# Patient Record
Sex: Female | Born: 1945
Health system: Southern US, Community
[De-identification: ages and names within clinical notes are randomized; demographics above are authoritative.]

## PROBLEM LIST (undated history)

## (undated) DIAGNOSIS — M7989 Other specified soft tissue disorders: Secondary | ICD-10-CM

## (undated) DIAGNOSIS — I10 Essential (primary) hypertension: Secondary | ICD-10-CM

## (undated) DIAGNOSIS — M1990 Unspecified osteoarthritis, unspecified site: Secondary | ICD-10-CM

## (undated) DIAGNOSIS — R32 Unspecified urinary incontinence: Secondary | ICD-10-CM

## (undated) HISTORY — DX: Unspecified urinary incontinence: R32

## (undated) HISTORY — DX: Unspecified osteoarthritis, unspecified site: M19.90

## (undated) HISTORY — DX: Essential (primary) hypertension: I10

## (undated) HISTORY — DX: Other specified soft tissue disorders: M79.89

---

## 1980-01-21 HISTORY — PX: ABDOMINAL HYSTERECTOMY: SHX81

## 2000-03-20 ENCOUNTER — Ambulatory Visit: Admission: RE | Admit: 2000-03-20 | Discharge: 2000-03-20 | Payer: Self-pay | Admitting: Neurosurgery

## 2000-03-20 ENCOUNTER — Encounter: Payer: Self-pay | Admitting: Neurosurgery

## 2001-01-20 HISTORY — PX: BACK SURGERY: SHX140

## 2001-01-25 ENCOUNTER — Encounter: Payer: Self-pay | Admitting: Neurosurgery

## 2001-01-27 ENCOUNTER — Inpatient Hospital Stay (HOSPITAL_COMMUNITY): Admission: RE | Admit: 2001-01-27 | Discharge: 2001-02-02 | Payer: Self-pay | Admitting: Neurosurgery

## 2001-01-27 ENCOUNTER — Encounter: Payer: Self-pay | Admitting: Neurosurgery

## 2001-03-02 ENCOUNTER — Ambulatory Visit (HOSPITAL_COMMUNITY): Admission: RE | Admit: 2001-03-02 | Discharge: 2001-03-02 | Payer: Self-pay | Admitting: Neurosurgery

## 2001-03-02 ENCOUNTER — Encounter: Payer: Self-pay | Admitting: Neurosurgery

## 2001-03-30 ENCOUNTER — Ambulatory Visit (HOSPITAL_COMMUNITY): Admission: RE | Admit: 2001-03-30 | Discharge: 2001-03-30 | Payer: Self-pay | Admitting: Neurosurgery

## 2001-03-30 ENCOUNTER — Encounter: Payer: Self-pay | Admitting: Neurosurgery

## 2001-06-01 ENCOUNTER — Encounter: Admission: RE | Admit: 2001-06-01 | Discharge: 2001-06-01 | Payer: Self-pay | Admitting: Neurosurgery

## 2001-06-01 ENCOUNTER — Encounter: Payer: Self-pay | Admitting: Neurosurgery

## 2001-07-01 ENCOUNTER — Encounter: Admission: RE | Admit: 2001-07-01 | Discharge: 2001-07-01 | Payer: Self-pay | Admitting: Neurosurgery

## 2001-07-01 ENCOUNTER — Encounter: Payer: Self-pay | Admitting: Neurosurgery

## 2001-08-03 ENCOUNTER — Encounter: Admission: RE | Admit: 2001-08-03 | Discharge: 2001-08-03 | Payer: Self-pay | Admitting: Neurosurgery

## 2001-08-03 ENCOUNTER — Encounter: Payer: Self-pay | Admitting: Neurosurgery

## 2001-10-05 ENCOUNTER — Encounter: Payer: Self-pay | Admitting: Neurosurgery

## 2001-10-05 ENCOUNTER — Encounter: Admission: RE | Admit: 2001-10-05 | Discharge: 2001-10-05 | Payer: Self-pay | Admitting: Neurosurgery

## 2002-04-05 ENCOUNTER — Encounter: Admission: RE | Admit: 2002-04-05 | Discharge: 2002-04-05 | Payer: Self-pay | Admitting: Neurosurgery

## 2002-04-05 ENCOUNTER — Encounter: Payer: Self-pay | Admitting: Neurosurgery

## 2005-08-21 ENCOUNTER — Encounter: Payer: Self-pay | Admitting: Vascular Surgery

## 2005-08-21 ENCOUNTER — Ambulatory Visit: Admission: RE | Admit: 2005-08-21 | Discharge: 2005-08-21 | Payer: Self-pay | Admitting: Endocrinology

## 2007-12-22 ENCOUNTER — Emergency Department (HOSPITAL_COMMUNITY): Admission: EM | Admit: 2007-12-22 | Discharge: 2007-12-23 | Payer: Self-pay | Admitting: Emergency Medicine

## 2007-12-24 ENCOUNTER — Emergency Department (HOSPITAL_COMMUNITY): Admission: EM | Admit: 2007-12-24 | Discharge: 2007-12-24 | Payer: Self-pay | Admitting: Emergency Medicine

## 2010-06-07 NOTE — Discharge Summary (Signed)
[place]  Patient:    Beverly Carlson, Beverly Carlson Visit Number: [id number] MRN: [id number]          Service Type: SUR Location: 3000 [id number] Attending Physician:  Mariam Dollar Dictated by:   Garlon Hatchet., M.D. 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  PROCEDURE:  Intracervical ______ and fusion from C4-C7, ______ at 5 and 6.  SECONDARY DIAGNOSES:  Diabetes mellitus, hypertension, complicating factors with coagulopathy.  HISTORY OF PRESENT ILLNESS:  Patient is a very pleasant 65 year old female who presented to the hospital.  Was admitted as an EMA and went to the operating room with the informed ______ procedure.  Postoperatively patient did very well. Went to the recovery room, then the floor.  On the floor patient was noted to be doing very well.  Had some mild amount of dysphagia as well as stiffness of her neck, but was eating well.  On hospital day #2 patient developed progressively worse dysphagia and a small amount of swelling underneath her incision.  Routine blood work showed an extension of her coagulopathy with INR 1.5.  Patient was given FNP and vitamin K and transferred to the step-down unit overnight but over the next few days patients coagulopathy slowly improved with an INR 1.2-1.3 and her dysphagia improved on steroids.  Patient continued to mobilize with physical therapy. Got progressively better and better and was able to be discharged home on the 14th.  At the time of discharge patient was neurologically stable with significantly improved arm pain and sensation with 5/5 strength.  Her swelling was intact.  Was able to tolerate soft foods.  Never had any breathing difficulty.  INR was 1.2 on discharge.  Patient was scheduled to follow up in two weeks in clinic  ______. Dictated by:   Garlon Hatchet., M.D. Attending Physician:  Mariam Dollar DD:  02/25/01 TD:  02/25/01 Job: [id number]

## 2010-06-07 NOTE — Op Note (Signed)
[place]. [facility]  Patient:    Beverly Carlson, Beverly Carlson Visit Number: [id number] MRN: [id number]          Service Type: SUR Location: 3100 [id number] Attending Physician:  Mariam Dollar Dictated by:   Garlon Hatchet., M.D. Proc. 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  PROCEDURES:  Anterior cervical corpectomies and fusion of C5 and C6 with an anterior cervical diskectomy and fusion using the Premier plate and a fibular strut graft.  SURGEON:  Garlon Hatchet., M.D.  ASSISTANT:  Julio Sicks, M.D.  ANESTHESIA:  General endotracheal.  CLINICAL NOTE:  The patient is a very pleasant 65 year old female who has had longstanding neck and left greater than right arm pain with numbness and tingling in her hands, with decreased strength in her hands and difficulty with her walking.  The patients preoperative imaging showed severe spinal stenosis from large ruptured disks and ossification of the posterior longitudinal ligament.  The patient was extensively counseled as to the risks of this degree of spinal cord compression and recommended to have two-level corpectomies and three-level diskectomy with a fusion from C4 to C7.  The patient was extensively explained the risks and benefits of surgery to include but not limited to bleeding, infection, spinal fluid leak, spinal cord injury, nerve damage, failure of fusion.  She understands and agrees to proceed forward.  DESCRIPTION OF PROCEDURE:  The patient was brought in the OR, was induced under general anesthesia, positioned supine with the neck in slight extension, five pounds of Holter traction.  The left side of her neck was prepped and draped in the routine sterile fashion.  Intraoperative x-ray with fluoroscopy confirmed  localization of the incision over the C5-6 disk space.  A transverse incision was made.  The superficial layer was dissected out and divided longitudinally.  The avascular plane between the sternocleidomastoid and the omohyoid was developed down to prevertebral fascia.  The prevertebral fascia was dissected away, exposing the C4, C5, C6, and C7 vertebral bodies and all those disk spaces.  The longus colli was reflected laterally.  Intraoperative x-ray confirmed localization of the C5-6 disk space.  There were large anterior osteophytes bitten off with the Leksell rongeur.  All three interspaces were incised with an 11 blade scalpel, and pituitary rongeurs were used to remove the anterior margin of the annulus.  They were noted to be large and calcified.  Using a high-speed drill with a blue 8 drill bit, the interspaces were drilled down approximately halfway through the annulus and nucleus pulposus.  Then using a Leksell rongeur, the anterior margin of the vertebral bodies of C6 and C5 were removed and then using the blue 7 drill bit and the high-speed drill, the remainder of the C6 and C5 vertebral bodies were drilled down to the posterior longitudinal ligament.  There was a lot of venous ooze appreciated.  This was controlled with Gelfoam and bone wax.  Then the C6 vertebral body kind of gave way to the posterior longitudinal ligament and using a 1 and 2 mm Kerrison punch, the remainder of the posterior aspect of the vertebral body was able to be removed and gutters were marched up on each side, leaving  the central stenotic part of the ossified ligament until the very end.  After both gutters had been done from C4 to C7, the remainder of the central stenosis was teased anterior and pulled away from the spinal cord.  There was a dramatic amount of spinal cord compression from a large ossified ligament being displaced from ruptured disk.  These were all teased away with microdissection  techniques and removed with the 2 and 3 mm Kerrison punch.  At the end of the diskectomies and two-level corpectomies with C6-7 being the most stenotic area, the thecal sac was noted to be widely decompressed and came right back up ventrally.  Then several large disk fragments were teased out with an angled nerve hook from behind the C6-7 disk space and C7 vertebral body and at the end of all diskectomies, both the C7 end plate and C4 end plate were all prepared to receive the bone graft. Shelves were made at the inferior aspect and posterior aspect of both vertebral bodies at C4 and C7.  Then the fibular allograft was sized, selected, and cut and fashioned to insert, then using a 0 silk tie, tied around the center of this and using the Caspar retractor pins, the C4 and C7 vertebral bodies were distracted against each other and the fibular strut was inserted.  Then the distractor pins were removed, the weight was removed from the head.  X-ray was brought in and confirmed good placement of the strut. Then the Premier plate was sized, selected, and using two fixed 13 mm screws in the vertebral body of C7 and two variable 13 mm screws in the vertebral body of C4 as well as tightening down the lock nuts and the locking mechanism, this plate was inserted.  Fluoroscopy confirmed good placement of the screws and plate.  The wound was copiously irrigated.  Meticulous hemostasis was maintained.  A JP drain was placed and the platysma was reapproximated with 3-0 interrupted Vicryl.  The skin was closed with running 4-0 subcuticular. Benzoin and Steri-Strips were applied.  The patient went to the recovery room in stable condition.  At the end of the case, all needle counts and sponge counts were correct. Dictated by:   Garlon Hatchet., M.D. Attending Physician:  Mariam Dollar DD:  01/27/01 TD:  01/28/01 Job: [id number]

## 2010-06-10 ENCOUNTER — Encounter (INDEPENDENT_AMBULATORY_CARE_PROVIDER_SITE_OTHER): Payer: Self-pay | Admitting: General Surgery

## 2010-08-16 ENCOUNTER — Encounter (INDEPENDENT_AMBULATORY_CARE_PROVIDER_SITE_OTHER): Payer: Self-pay | Admitting: General Surgery

## 2010-08-16 ENCOUNTER — Other Ambulatory Visit (INDEPENDENT_AMBULATORY_CARE_PROVIDER_SITE_OTHER): Payer: Self-pay | Admitting: General Surgery

## 2010-08-16 ENCOUNTER — Ambulatory Visit (INDEPENDENT_AMBULATORY_CARE_PROVIDER_SITE_OTHER): Payer: No Typology Code available for payment source | Admitting: General Surgery

## 2010-08-16 VITALS — Temp 96.2°F | Wt 255.4 lb

## 2010-08-16 DIAGNOSIS — E1149 Type 2 diabetes mellitus with other diabetic neurological complication: Secondary | ICD-10-CM

## 2010-08-16 DIAGNOSIS — E1143 Type 2 diabetes mellitus with diabetic autonomic (poly)neuropathy: Secondary | ICD-10-CM

## 2010-08-16 DIAGNOSIS — L600 Ingrowing nail: Secondary | ICD-10-CM

## 2010-08-16 DIAGNOSIS — G909 Disorder of the autonomic nervous system, unspecified: Secondary | ICD-10-CM

## 2010-08-16 NOTE — Progress Notes (Signed)
Subjective:     Patient ID: Beverly Carlson, female   DOB: Jun 05, 1945, 65 y.o.   MRN: [id number]  HPI This is a has chronic subungal ungual toenail infections fungus and is having problems with her generalized muscle weakness for which she is on steroids presently done and 15 mg prednisone daily I last saw her approximately 6 weeks and she says her muscle weakness is improving. Her diabetes however have been all types of adjustments by Dr. Lucianne Muss that is being controlled with.  She said she's had a little pain in the left great toenail over the last week but has not noticed any fever or drainage  Review of SystemsCurrent outpatient prescriptions:furosemide (LASIX) 10 MG/ML solution, Take by mouth daily.  , Disp: , Rfl: ;  ramipril (ALTACE) 1.25 MG capsule, Take 1.25 mg by mouth daily.  , Disp: , Rfl:      Objective:   Physical Exam Patient has bilateral edema probably 2+ no evidence of cellulitis and no ulcers around her ulcer ankles her toenails are curved in. and and appeared vision is  fungal infection. I trimmed all 10 toenails but on the left great toenail is an obvious infection that has a little spell and had sent a definite culture trim the nail back to its base where there is a chronic ingrown toenail both medial and lateral. I she had a similar problem about 2 years ago that I had her on Keflex and given her a prescription for 500 mg q.i.d. saline the culture and will need to see her next week she is going to soak her feet twice a day and follow Betadine to the nail areas    Assessment:       Plan:    Return next Thursday and the patient is aware she's having increased pain drainage redness or worse problems to call Robin and let me see her sooner

## 2010-08-16 NOTE — Patient Instructions (Signed)
Soap twice a day in warm water both feet and apply Betadine solution to the toenail areas. Start Keflex 500 mg q.i.d. today and if any increased swelling pain or problen : Beverly Carlson so I can see you earlier

## 2010-08-19 LAB — WOUND CULTURE

## 2010-08-22 ENCOUNTER — Encounter (INDEPENDENT_AMBULATORY_CARE_PROVIDER_SITE_OTHER): Payer: Self-pay | Admitting: General Surgery

## 2010-08-22 ENCOUNTER — Ambulatory Visit (INDEPENDENT_AMBULATORY_CARE_PROVIDER_SITE_OTHER): Payer: No Typology Code available for payment source | Admitting: General Surgery

## 2010-08-22 DIAGNOSIS — L600 Ingrowing nail: Secondary | ICD-10-CM

## 2010-08-22 DIAGNOSIS — R600 Localized edema: Secondary | ICD-10-CM

## 2010-08-22 DIAGNOSIS — R609 Edema, unspecified: Secondary | ICD-10-CM

## 2010-08-22 DIAGNOSIS — E109 Type 1 diabetes mellitus without complications: Secondary | ICD-10-CM

## 2010-08-22 NOTE — Progress Notes (Signed)
Subjective:     Patient ID: Beverly Carlson, female   DOB: [birth year], 65 y.o.   MRN: [id number]  HPIPatient returns and a tight left great is greatly improved the cultures showed staph epi not aureus MRSA and everything is progressing nicely she's got a few more days of Keflex which she will complete her legs are less swollen and Dr. Lucianne Muss has increased his Lasix again and I recommend that she get away H. placed between her mattress and then spurring to try to keep her feet elevated above her heart at night I will plan on seeing her in approximately 3 weeks or 4 and following the chronic fungal infection of the toenails  Review of Systems     Objective:   Physical Exam     Assessment:        Plan:    Complete antibiotics and pliable antibiotic ointment to the  toenails. Elevate it with the as much as possible to decrease the edema

## 2010-08-22 NOTE — Patient Instructions (Signed)
Continue the antibiotics until completed continue soaking her feet and upon the antibiotic ointment. Elevate her feet as much as possible to decrease the edema

## 2010-08-27 ENCOUNTER — Encounter (HOSPITAL_BASED_OUTPATIENT_CLINIC_OR_DEPARTMENT_OTHER): Payer: No Typology Code available for payment source | Attending: General Surgery

## 2010-08-27 ENCOUNTER — Other Ambulatory Visit (HOSPITAL_BASED_OUTPATIENT_CLINIC_OR_DEPARTMENT_OTHER): Payer: Self-pay | Admitting: General Surgery

## 2010-08-27 DIAGNOSIS — IMO0001 Reserved for inherently not codable concepts without codable children: Secondary | ICD-10-CM | POA: Insufficient documentation

## 2010-08-27 DIAGNOSIS — I872 Venous insufficiency (chronic) (peripheral): Secondary | ICD-10-CM | POA: Insufficient documentation

## 2010-08-27 DIAGNOSIS — Z79899 Other long term (current) drug therapy: Secondary | ICD-10-CM | POA: Insufficient documentation

## 2010-08-27 DIAGNOSIS — E669 Obesity, unspecified: Secondary | ICD-10-CM | POA: Insufficient documentation

## 2010-08-27 DIAGNOSIS — L97809 Non-pressure chronic ulcer of other part of unspecified lower leg with unspecified severity: Secondary | ICD-10-CM | POA: Insufficient documentation

## 2010-08-27 DIAGNOSIS — R609 Edema, unspecified: Secondary | ICD-10-CM | POA: Insufficient documentation

## 2010-08-27 DIAGNOSIS — Z794 Long term (current) use of insulin: Secondary | ICD-10-CM | POA: Insufficient documentation

## 2010-08-27 DIAGNOSIS — E119 Type 2 diabetes mellitus without complications: Secondary | ICD-10-CM | POA: Insufficient documentation

## 2010-08-27 DIAGNOSIS — IMO0002 Reserved for concepts with insufficient information to code with codable children: Secondary | ICD-10-CM | POA: Insufficient documentation

## 2010-08-27 DIAGNOSIS — I10 Essential (primary) hypertension: Secondary | ICD-10-CM | POA: Insufficient documentation

## 2010-08-27 LAB — GLUCOSE, CAPILLARY: Glucose-Capillary: 111 mg/dL — ABNORMAL HIGH (ref 70–99)

## 2010-09-09 ENCOUNTER — Encounter (INDEPENDENT_AMBULATORY_CARE_PROVIDER_SITE_OTHER): Payer: No Typology Code available for payment source

## 2010-09-09 DIAGNOSIS — L97909 Non-pressure chronic ulcer of unspecified part of unspecified lower leg with unspecified severity: Secondary | ICD-10-CM

## 2010-09-13 ENCOUNTER — Encounter (INDEPENDENT_AMBULATORY_CARE_PROVIDER_SITE_OTHER): Payer: Self-pay | Admitting: General Surgery

## 2010-09-13 ENCOUNTER — Ambulatory Visit (INDEPENDENT_AMBULATORY_CARE_PROVIDER_SITE_OTHER): Payer: No Typology Code available for payment source | Admitting: General Surgery

## 2010-09-13 VITALS — BP 136/74 | HR 76 | Temp 97.9°F | Ht 66.0 in | Wt 244.4 lb

## 2010-09-13 DIAGNOSIS — E119 Type 2 diabetes mellitus without complications: Secondary | ICD-10-CM

## 2010-09-13 DIAGNOSIS — L600 Ingrowing nail: Secondary | ICD-10-CM

## 2010-09-13 NOTE — Procedures (Unsigned)
DUPLEX DEEP VENOUS EXAM - LOWER EXTREMITY  INDICATION:  Ulcer on right shin for 1 month.  HISTORY:  Edema:  Yes. Trauma/Surgery:  No. Pain:  No. PE:  No. Previous DVT:  No. Anticoagulants:  No. Other:  Prednisone use.  DUPLEX EXAM:               CFV   SFV   PopV  PTV    GSV               R  L  R  L  R  L  R   L  R  L Thrombosis    o  o  o  o  o  o  o   o  o  o Spontaneous   +  +  +  +  +  + Phasic        +  +  +  +  +  + Augmentation  +  +  +  +  +  +  +   +  +  + Compressible  +  +  +  +  +  +         +  + Competent  Legend:  + - yes  o - no  p - partial  D - decreased  IMPRESSION: 1. No evidence of deep venous thrombosis or superficial venous     thrombus in the bilateral lower extremities.  However, distal     superficial femoral vein and calf veins were difficult to visualize     due to edema. 2. Valsalva and manual compressions were not effective at     demonstrating incompetency due to increased venous flow in the     bilateral lower extremities.   _____________________________ Di Kindle. Edilia Bo, M.D.  LT/MEDQ  D:  09/09/2010  T:  09/09/2010  Job:  [id number]

## 2010-09-13 NOTE — Patient Instructions (Signed)
Continue wash and scrub in her toenails as usual he may apply just a little bit of antibiotic ointment to the left second toe if there's any bleeding continue with the use support stockings and hopefully decrease in the swelling of the lower extremity

## 2010-09-13 NOTE — Progress Notes (Signed)
Subjective:     Patient ID: Beverly Carlson, female   DOB: [birth year], 65 y.o.   MRN: [id number]  HPIPatient returns and she is continuing to improve the facial swelling from the steroids are definitely bad in since I've seen her she's had a complete arterial and venous Doppler studies of the lower extremities and reports that everything looked good. She had some alters around her ankle where she was massively swollen but these have healed. The toenails where she had a little abscess on the left great toe nail bed his healed there is a little ingrown toenail on the left lateral that I clipped but that was the only procedure needed to do.   Review of Systems     Objective:   Physical Exam     Assessment:    Improve and chronic fungal toenail infections on all 10 toes with no active bacterial infection at this time continues to conservative management or lymphedema secondary to her high dose steroids but the steroids are then reduced and her neuropathy is improved     Plan:    Continue the same soaking toenails and trying to correct or prevent the pedal edema. See me in 2 months

## 2010-09-24 ENCOUNTER — Encounter (HOSPITAL_BASED_OUTPATIENT_CLINIC_OR_DEPARTMENT_OTHER): Payer: No Typology Code available for payment source

## 2010-10-24 LAB — URINALYSIS, ROUTINE W REFLEX MICROSCOPIC
Nitrite: NEGATIVE
Specific Gravity, Urine: 1.012 (ref 1.005–1.030)
Specific Gravity, Urine: 1.025 (ref 1.005–1.030)
Urobilinogen, UA: 0.2 mg/dL (ref 0.0–1.0)
Urobilinogen, UA: 0.2 mg/dL (ref 0.0–1.0)

## 2010-10-24 LAB — URINE MICROSCOPIC-ADD ON

## 2010-10-24 LAB — CBC
Hemoglobin: 13.7 g/dL (ref 12.0–15.0)
RBC: 4.77 MIL/uL (ref 3.87–5.11)
RDW: 13.2 % (ref 11.5–15.5)

## 2010-10-24 LAB — URINE CULTURE

## 2010-11-13 ENCOUNTER — Encounter (INDEPENDENT_AMBULATORY_CARE_PROVIDER_SITE_OTHER): Payer: Self-pay | Admitting: General Surgery

## 2010-11-13 ENCOUNTER — Ambulatory Visit (INDEPENDENT_AMBULATORY_CARE_PROVIDER_SITE_OTHER): Payer: No Typology Code available for payment source | Admitting: General Surgery

## 2010-11-13 VITALS — BP 106/72 | HR 68 | Temp 95.6°F | Resp 20 | Ht 66.25 in | Wt 245.2 lb

## 2010-11-13 DIAGNOSIS — E119 Type 2 diabetes mellitus without complications: Secondary | ICD-10-CM

## 2010-11-13 DIAGNOSIS — B351 Tinea unguium: Secondary | ICD-10-CM

## 2010-11-13 NOTE — Progress Notes (Signed)
Subjective:     Patient ID: Beverly Carlson, female   DOB: September 15, 1945, 65 y.o.   MRN: [id number]  HPIInstead a return she is now approximately 6 weeks since I last saw had a good trip when she went in cared for her grandchildren recently and returned her toenails are basically very hyper taken chronic so fungal infection on 1210 I. the little area on the left great toe nail that had the abscess previously no evidence of any active infection at what they're still a little bit more cannulation tissue and ingrown aspect of her nail (. She's done on her steroids onto 5 mg a day and looks more like she used to look and says that she's doing much better from the neurological problems that were treated with the steroids.   Review of Systems Current Outpatient Prescriptions  Medication Sig Dispense Refill  . BD INSULIN SYRINGE ULTRAFINE 31G X 5/16" 0.5 ML MISC Daily.      . bisoprolol-hydrochlorothiazide (ZIAC) 10-6.25 MG per tablet Daily.      . furosemide (LASIX) 10 MG/ML solution Take by mouth daily.        Marland Kitchen LANTUS 100 UNIT/ML injection Daily.      . metFORMIN (GLUCOPHAGE-XR) 500 MG 24 hr tablet Daily.      . metolazone (ZAROXOLYN) 5 MG tablet Daily.      Marland Kitchen NOVOLOG 100 UNIT/ML injection       . predniSONE (DELTASONE) 10 MG tablet 5 mg Daily.       . ramipril (ALTACE) 1.25 MG capsule Take 1.25 mg by mouth daily.        Lilian Kapur 625 MG tablet        Past Surgical History  Procedure Date  . Back surgery 2003  . Abdominal hysterectomy 1982    partial       Objective:   Physical ExamBP 106/72  Pulse 68  Temp 95.6 F (35.3 C)  Resp 20  Ht 5' 6.25" (1.683 m)  Wt 245 lb 4 oz (111.245 kg)  BMI 39.29 kg/m2 I inspected all 10 of her nails and TRAM each of the nails the left great toenail is somewhat angry and trim back the area but I don't think is the fact fraction she is a possible triple antibiotic ointment to that nail and will get a nail file r Board to count paramount of sand and the top of  the nails that are checked and are closed. I recommended that she make arrangements to see one of the podiatrist I think it's tried foot specialist as is not any of my partners do any type of trauma mouth chronic toenail problems. I will plan on seeing her again in December and had placed blue she can get completely off the prednisone by that time     Assessment:         Plan:     See above note

## 2010-12-09 NOTE — H&P (Signed)
  NAME:  RECIE, CIRRINCIONE NO.:  [id number]  MEDICAL RECORD NO.:  [id number]  LOCATION:  FOOT                         FACILITY:  [facility]  PHYSICIAN:  Ardath Sax, M.D.     DATE OF BIRTH:  1945/07/06  DATE OF ADMISSION:  08/27/2010 DATE OF DISCHARGE:                             HISTORY & PHYSICAL   She is a 65 year old very obese African American female who was here because of venous stasis ulcers on her right leg.  She has a history of myositis of some sort and her doctor has put her on high doses of prednisone which has caused her to have a great deal of edema.  She has pitting edema of both of her extremities with ulcers on the anterior aspect of her right leg that are quite painful and she was sent here for treatment of this situation.  The ulcer measures 17 x 5 cm and are 0.1 cm deep.  Her blood pressure is 127/77.  Her respirations are 19.  Her pulse is 79.  Her temperature is 98.6.  she is on many medicines including Ziac 6.25 and she is also on Zaroxolyn.  She takes NovoLog insulin, Lantus, she takes 54 units at bedtime, and she also takes 10-24 units of Humalog twice a day.  She is also on Glucophage, Altace, and she takes 80 mg of Lasix a day.  I feel because of the marked edema and the venous ulcers, she should be getting off her prednisone because I do not really see any reason for it, so she is going to talk her doctor who said he is already planning to slowly decrease the dosage.  She needs to have Unna boots for these venous ulcers.  I could feel excellent pulses, but because of the clinic routinely orders vascular studies prior to [employer], we ordered vascular studies and on this visit we put Silvadene on and wrapped dressings with Profore light.  She is a lady who has peripheral edema.  She is diabetic.  She does have hypertension. She is markedly obese and she is on many medicines, and I would like to get her off at least the prednisone, and when  she comes back next week, we will plan on putting on Unna boots if the vascular studies are okay.     Ardath Sax, M.D.     PP/MEDQ  D:  08/27/2010  T:  08/28/2010  Job:  [id number]  Electronically Signed by Ardath Sax  on 12/09/2010 02:42:36 PM

## 2011-01-02 ENCOUNTER — Encounter (INDEPENDENT_AMBULATORY_CARE_PROVIDER_SITE_OTHER): Payer: No Typology Code available for payment source | Admitting: General Surgery

## 2011-01-09 ENCOUNTER — Ambulatory Visit (INDEPENDENT_AMBULATORY_CARE_PROVIDER_SITE_OTHER): Payer: Medicare Other | Admitting: General Surgery

## 2011-01-09 ENCOUNTER — Encounter (INDEPENDENT_AMBULATORY_CARE_PROVIDER_SITE_OTHER): Payer: Self-pay | Admitting: General Surgery

## 2011-01-09 DIAGNOSIS — E1142 Type 2 diabetes mellitus with diabetic polyneuropathy: Secondary | ICD-10-CM

## 2011-01-09 DIAGNOSIS — E119 Type 2 diabetes mellitus without complications: Secondary | ICD-10-CM

## 2011-01-09 DIAGNOSIS — B351 Tinea unguium: Secondary | ICD-10-CM | POA: Insufficient documentation

## 2011-01-09 DIAGNOSIS — E1140 Type 2 diabetes mellitus with diabetic neuropathy, unspecified: Secondary | ICD-10-CM

## 2011-01-09 DIAGNOSIS — E1149 Type 2 diabetes mellitus with other diabetic neurological complication: Secondary | ICD-10-CM

## 2011-01-09 NOTE — Patient Instructions (Signed)
He is in [employer] to count was then softened  and flaten areas were I trimed your toenails. Make an appointment with the podiatrist in about 6 weeks

## 2011-01-09 NOTE — Progress Notes (Signed)
Patient ID: Beverly Carlson, female   DOB: March 14, 1945, 65 y.o.   MRN: [id number] BP 140/78  Pulse 68  Temp(Src) 97.4 F (36.3 C) (Temporal)  Resp 16  Ht 5' 6.25" (1.683 m)  Wt 236 lb 6.4 oz (107.23 kg)  BMI 37.87 kg/m2 Beverly Carlson returns now proximally 6 weeks since her last treatment of her chronic fungal toenail infection and says she is not actually called to make an appointment with the podiatrist but has their number. She read about my retirement in the diaper and said it stated that my partners would continue CM my patients but I explained to her that her chronic fungal toenail infection or not problems at general surgeons usually continue following her even though she's been my patient for 20 years. I think she would be better managed by a podiatrist and she will call to make the appointment. Her steroids have been tapered by the neurologist at [facility] who were treated her for her neuropathy and she looks closer to watch her chronic appearance is. She's still been followed by the endocrinologist for her diabetes. I trimmed all 10 of her toenails today she has this chronic elevated thickened [name] and would recommend that she use an Rye board to try to smooth the areas and hopefully the podiatrist will have a better solution even though she continued to have alternatives as not had inactive infection and I have been managed her for many many years

## 2012-08-24 ENCOUNTER — Ambulatory Visit: Payer: Medicare Other | Admitting: Endocrinology

## 2012-09-13 ENCOUNTER — Other Ambulatory Visit: Payer: Self-pay | Admitting: *Deleted

## 2012-09-13 MED ORDER — COLESEVELAM HCL 625 MG PO TABS: ORAL_TABLET | ORAL | Status: DC

## 2012-09-27 ENCOUNTER — Ambulatory Visit: Payer: Medicare Other | Admitting: Endocrinology

## 2012-10-26 ENCOUNTER — Ambulatory Visit: Payer: Medicare Other | Admitting: Endocrinology

## 2012-11-10 ENCOUNTER — Other Ambulatory Visit: Payer: Self-pay | Admitting: *Deleted

## 2012-11-10 MED ORDER — "INSULIN SYRINGE-NEEDLE U-100 31G X 5/16"" 0.5 ML MISC": Status: DC

## 2012-11-10 MED ORDER — RAMIPRIL 10 MG PO CAPS: ORAL_CAPSULE | ORAL | Status: DC

## 2012-11-10 MED ORDER — BISOPROLOL-HYDROCHLOROTHIAZIDE 10-6.25 MG PO TABS: 1.0000 | ORAL_TABLET | Freq: Every day | ORAL | Status: DC

## 2012-11-17 ENCOUNTER — Other Ambulatory Visit: Payer: Self-pay | Admitting: *Deleted

## 2012-11-17 ENCOUNTER — Telehealth: Payer: Self-pay | Admitting: Endocrinology

## 2012-11-17 MED ORDER — "INSULIN SYRINGE-NEEDLE U-100 31G X 5/16"" 1 ML MISC": 1.0000 | Freq: Two times a day (BID) | Status: DC

## 2012-11-18 ENCOUNTER — Other Ambulatory Visit: Payer: Self-pay | Admitting: *Deleted

## 2012-11-18 MED ORDER — "INSULIN SYRINGE-NEEDLE U-100 31G X 5/16"" 1 ML MISC": 1.0000 | Freq: Two times a day (BID) | Status: DC

## 2012-11-19 ENCOUNTER — Ambulatory Visit: Payer: Medicare Other | Admitting: Endocrinology

## 2012-12-13 ENCOUNTER — Encounter: Payer: Self-pay | Admitting: Endocrinology

## 2012-12-13 ENCOUNTER — Ambulatory Visit (INDEPENDENT_AMBULATORY_CARE_PROVIDER_SITE_OTHER): Payer: Medicare Other | Admitting: Endocrinology

## 2012-12-13 VITALS — BP 138/80 | HR 74 | Temp 98.5°F | Resp 12 | Ht 66.25 in | Wt 241.4 lb

## 2012-12-13 DIAGNOSIS — R609 Edema, unspecified: Secondary | ICD-10-CM

## 2012-12-13 DIAGNOSIS — E1142 Type 2 diabetes mellitus with diabetic polyneuropathy: Secondary | ICD-10-CM

## 2012-12-13 DIAGNOSIS — E1149 Type 2 diabetes mellitus with other diabetic neurological complication: Secondary | ICD-10-CM

## 2012-12-13 DIAGNOSIS — I10 Essential (primary) hypertension: Secondary | ICD-10-CM

## 2012-12-13 DIAGNOSIS — M3320 Polymyositis, organ involvement unspecified: Secondary | ICD-10-CM

## 2012-12-13 DIAGNOSIS — E1140 Type 2 diabetes mellitus with diabetic neuropathy, unspecified: Secondary | ICD-10-CM

## 2012-12-13 DIAGNOSIS — R600 Localized edema: Secondary | ICD-10-CM | POA: Insufficient documentation

## 2012-12-13 DIAGNOSIS — E785 Hyperlipidemia, unspecified: Secondary | ICD-10-CM

## 2012-12-13 LAB — CBC WITH DIFFERENTIAL/PLATELET
Basophils Absolute: 0 10*3/uL (ref 0.0–0.1)
Eosinophils Relative: 3.1 % (ref 0.0–5.0)
MCV: 88.2 fl (ref 78.0–100.0)
Monocytes Absolute: 0.4 10*3/uL (ref 0.1–1.0)
Monocytes Relative: 9 % (ref 3.0–12.0)
Neutrophils Relative %: 46.3 % (ref 43.0–77.0)
Platelets: 246 10*3/uL (ref 150.0–400.0)
RDW: 14.1 % (ref 11.5–14.6)
WBC: 4.4 10*3/uL — ABNORMAL LOW (ref 4.5–10.5)

## 2012-12-13 LAB — MICROALBUMIN / CREATININE URINE RATIO
Creatinine,U: 161.4 mg/dL
Microalb Creat Ratio: 1.2 mg/g (ref 0.0–30.0)
Microalb, Ur: 2 mg/dL — ABNORMAL HIGH (ref 0.0–1.9)

## 2012-12-13 LAB — URINALYSIS, ROUTINE W REFLEX MICROSCOPIC
Bilirubin Urine: NEGATIVE
Ketones, ur: NEGATIVE
Leukocytes, UA: NEGATIVE
Urobilinogen, UA: 0.2 (ref 0.0–1.0)

## 2012-12-13 LAB — LIPID PANEL
Cholesterol: 215 mg/dL — ABNORMAL HIGH (ref 0–200)
HDL: 35.1 mg/dL — ABNORMAL LOW (ref >39.00)
Total CHOL/HDL Ratio: 6
Triglycerides: 134 mg/dL (ref 0.0–149.0)

## 2012-12-13 LAB — COMPREHENSIVE METABOLIC PANEL
AST: 25 U/L (ref 0–37)
BUN: 28 mg/dL — ABNORMAL HIGH (ref 6–23)
CO2: 22 mEq/L (ref 19–32)
Calcium: 9.3 mg/dL (ref 8.4–10.5)
Chloride: 106 mEq/L (ref 96–112)
Creatinine, Ser: 1.1 mg/dL (ref 0.4–1.2)
GFR: 66.46 mL/min (ref >60.00)
Glucose, Bld: 67 mg/dL — ABNORMAL LOW (ref 70–99)

## 2012-12-13 LAB — LDL CHOLESTEROL, DIRECT: Direct LDL: 159.5 mg/dL

## 2012-12-13 NOTE — Patient Instructions (Signed)
Welchol 3 tabs twice daily

## 2012-12-13 NOTE — Progress Notes (Signed)
Patient ID: Beverly Carlson, female   DOB: 01-Jun-1945, 67 y.o.   MRN: [id number]   Reason for Appointment: Diabetes follow-up   History of Present Illness   Diagnosis: Type 2 DIABETES MELITUS, date of diagnosis:1992      Previous history: She was initially treated with metformin and subsequently glyburide. In 2002 insulin was added She has been on basal bolus insulin regimen with metformin with variable control over the last few years She is generally noncompliant with followup and glucose monitoring  Recent history: She has not been seen in followup since 12/2011 At that time her A1c was 6.7% However she has gained weight since then. A1c is not available today Again has not brought her monitor for download and not clear how often she is checking     Oral hypoglycemic drugs:        Side effects from medications: None Insulin regimen: 50  Lantus at bedtime. NovoLog   16 with lunch and 28 with supper       Proper timing of medications in relation to meals: Yes.          Monitors blood glucose:  irregularly   Glucometer:  FreeStyle         Blood Glucose readings from meter download: readings before breakfast:  79, pcl 117; highest 130; not taking pcs Hypoglycemia frequency:  none         Meals:  2 meals per day.          Physical activity: exercise: Minimal Dietitian visit: 2002    Weight control: Wt Readings from Last 3 Encounters:  12/13/12 241 lb 6.4 oz (109.498 kg)  01/09/11 236 lb 6.4 oz (107.23 kg)  11/13/10 245 lb 4 oz (111.245 kg)          Complications:  none. Has past history of femoral neuropathy     Diabetes labs:  No results found for this basename: HGBA1C   No results found for this basename: GLUF, MICROALBUR, LDLCALC, CREATININE      Medication List       This list is accurate as of: 12/13/12  1:56 PM.  Always use your most recent med list.               bisoprolol-hydrochlorothiazide 10-6.25 MG per tablet  Commonly known as:  ZIAC  Take 1 tablet by  mouth daily.     colesevelam 625 MG tablet  Commonly known as:  WELCHOL  Take 3 tablets twice a day     furosemide 10 MG/ML solution  Commonly known as:  LASIX  Take by mouth daily.     Insulin Syringe-Needle U-100 31G X 5/16" 0.5 ML Misc  Commonly known as:  BD INSULIN SYRINGE ULTRAFINE  Use 2-3 syringes daily     Insulin Syringe-Needle U-100 31G X 5/16" 1 ML Misc  1 each by Does not apply route 2 (two) times daily.     LANTUS 100 UNIT/ML injection  Generic drug:  insulin glargine  50 Units at bedtime.     metFORMIN 500 MG 24 hr tablet  Commonly known as:  GLUCOPHAGE-XR  500 mg Daily. Take 2 tablets twice a day     metolazone 5 MG tablet  Commonly known as:  ZAROXOLYN  Daily.     NOVOLOG 100 UNIT/ML injection  Generic drug:  insulin aspart  Takes 16 units in the morning and 28 units in the evening     predniSONE 10 MG tablet  Commonly known as:  DELTASONE  2.5 mg Daily.     ramipril 10 MG capsule  Commonly known as:  ALTACE  Take one tablet daily        Allergies:  Allergies  Allergen Reactions  . Codeine Anxiety    Past Medical History  Diagnosis Date  . Diabetes mellitus   . Hypertension   . Bilateral swelling of feet   . Arthritis   . Incontinence   . Thyroid disease     Past Surgical History  Procedure Laterality Date  . Back surgery  2003  . Abdominal hysterectomy  1982    partial    Family History  Problem Relation Age of Onset  . Diabetes Mother   . Heart disease Mother   . Mental illness Father     dementia  . Diabetes Sister   . Diabetes Sister   . Heart disease Sister     Social History:  reports that she quit smoking about 33 years ago. She has never used smokeless tobacco. She reports that she does not drink alcohol or use illicit drugs.  Review of Systems:  Hypertension:  this was not well controlled on her last visit and associated with edema, Lasix was increased to 120 mg a day  Lipids: Not well controlled because of  not taking statins. 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 She has recently reduced her prednisone to 2.5 mg    LABS:  None available   Examination:   BP 138/80  Pulse 74  Temp(Src) 98.5 F (36.9 C)  Resp 12  Ht 5' 6.25" (1.683 m)  Wt 241 lb 6.4 oz (109.498 kg)  BMI 38.66 kg/m2  SpO2 96%  Body mass index is 38.66 kg/(m^2).   No lower leg edema  ASSESSMENT/ PLAN::   1. Diabetes type 2  Blood glucose control will be assessed with A1c today. She is again very noncompliant with glucose monitoring, followup and exercise Also has gained weight probably from poor lifestyle Again discussed better compliance with the above measures may need to bring monitor for download Will see her in 3 months for followup  2. HYPERTENSION: Well controlled now, will check microalbumin also  3. Lipids to be checked. Will need to find out from neurologist if she can take a statin drug now since she did not have statin-related myopathy   4. Dependent pedal edema, improved  Beverly Carlson 12/13/2012, 1:56 PM

## 2012-12-14 NOTE — Progress Notes (Signed)
Quick Note:  Please let patient know that the A1c is too high, needs more testing esp after dinner, rest ok ______

## 2012-12-22 ENCOUNTER — Ambulatory Visit (INDEPENDENT_AMBULATORY_CARE_PROVIDER_SITE_OTHER): Payer: Medicare Other | Admitting: Podiatrist

## 2012-12-22 ENCOUNTER — Encounter: Payer: Self-pay | Admitting: Podiatrist

## 2012-12-22 VITALS — BP 131/71 | HR 80 | Resp 16

## 2012-12-22 DIAGNOSIS — B351 Tinea unguium: Secondary | ICD-10-CM

## 2012-12-22 DIAGNOSIS — M79609 Pain in unspecified limb: Secondary | ICD-10-CM

## 2012-12-22 NOTE — Patient Instructions (Signed)
Diabetes and Foot Care Diabetes may cause you to have problems because of poor blood supply (circulation) to your feet and legs. This may cause the skin on your feet to become thinner, break easier, and heal more slowly. Your skin may become dry, and the skin may peel and crack. You may also have nerve damage in your legs and feet causing decreased feeling in them. You may not notice minor injuries to your feet that could lead to infections or more serious problems. Taking care of your feet is one of the most important things you can do for yourself.  HOME CARE INSTRUCTIONS  Wear shoes at all times, even in the house. Do not go barefoot. Bare feet are easily injured.  Check your feet daily for blisters, cuts, and redness. If you cannot see the bottom of your feet, use a mirror or ask someone for help.  Wash your feet with warm water (do not use hot water) and mild soap. Then pat your feet and the areas between your toes until they are completely dry. Do not soak your feet as this can dry your skin.  Apply a moisturizing lotion or petroleum jelly (that does not contain alcohol and is unscented) to the skin on your feet and to dry, brittle toenails. Do not apply lotion between your toes.  Trim your toenails straight across. Do not dig under them or around the cuticle. File the edges of your nails with an emery board or nail file.  Do not cut corns or calluses or try to remove them with medicine.  Wear clean socks or stockings every day. Make sure they are not too tight. Do not wear knee-high stockings since they may decrease blood flow to your legs.  Wear shoes that fit properly and have enough cushioning. To break in new shoes, wear them for just a few hours a day. This prevents you from injuring your feet. Always look in your shoes before you put them on to be sure there are no objects inside.  Do not cross your legs. This may decrease the blood flow to your feet.  If you find a minor scrape,  cut, or break in the skin on your feet, keep it and the skin around it clean and dry. These areas may be cleansed with mild soap and water. Do not cleanse the area with peroxide, alcohol, or iodine.  When you remove an adhesive bandage, be sure not to damage the skin around it.  If you have a wound, look at it several times a day to make sure it is healing.  Do not use heating pads or hot water bottles. They may burn your skin. If you have lost feeling in your feet or legs, you may not know it is happening until it is too late.  Make sure your health care provider performs a complete foot exam at least annually or more often if you have foot problems. Report any cuts, sores, or bruises to your health care provider immediately. SEEK MEDICAL CARE IF:   You have an injury that is not healing.  You have cuts or breaks in the skin.  You have an ingrown nail.  You notice redness on your legs or feet.  You feel burning or tingling in your legs or feet.  You have pain or cramps in your legs and feet.  Your legs or feet are numb.  Your feet always feel cold. SEEK IMMEDIATE MEDICAL CARE IF:   There is increasing redness,   swelling, or pain in or around a wound.  There is a red line that goes up your leg.  Pus is coming from a wound.  You develop a fever or as directed by your health care provider.  You notice a bad smell coming from an ulcer or wound. Document Released: 01/04/2000 Document Revised: 09/08/2012 Document Reviewed: 06/15/2012 [employer] Patient Information 2014 [employer].  

## 2012-12-22 NOTE — Progress Notes (Signed)
HPI:  Patient presents today for follow up of foot and nail care. Denies any new complaints today.  Objective:  Patients chart is reviewed.  Neurovascular status unchanged.  Patients nails are thickened, discolored, distrophic, friable and brittle with yellow-brown discoloration. Patient subjectively relates they are painful with shoes and with ambulation of bilateral feet.  Assessment:  Symptomatic onychomycosis  Plan:  Discussed treatment options and alternatives.  The symptomatic toenails were debrided through manual an mechanical means without complication.  Return appointment recommended at routine intervals of 3 months    Ladislav Caselli, DPM   

## 2013-01-07 ENCOUNTER — Other Ambulatory Visit: Payer: Self-pay | Admitting: *Deleted

## 2013-01-07 MED ORDER — INSULIN ASPART 100 UNIT/ML ~~LOC~~ SOLN: SUBCUTANEOUS | Status: DC

## 2013-01-07 MED ORDER — INSULIN GLARGINE 100 UNIT/ML ~~LOC~~ SOLN: 50.0000 [IU] | Freq: Every day | SUBCUTANEOUS | Status: DC

## 2013-01-25 ENCOUNTER — Other Ambulatory Visit: Payer: Self-pay | Admitting: *Deleted

## 2013-01-25 ENCOUNTER — Telehealth: Payer: Self-pay | Admitting: *Deleted

## 2013-01-25 MED ORDER — HYDROCOD POLST-CHLORPHEN POLST 10-8 MG/5ML PO LQCR: 5.0000 mL | Freq: Two times a day (BID) | ORAL | Status: DC

## 2013-01-25 NOTE — Telephone Encounter (Signed)
Noted, rx faxed to pharmacy, patient is aware

## 2013-01-25 NOTE — Telephone Encounter (Signed)
She can have Tussionex 5 ML twice a day, 115 mL bottle

## 2013-01-25 NOTE — Telephone Encounter (Signed)
Pt is complaining of a bad cold, she states she has sinus pain/congestion, cough X 2 weeks.  She has been taking Tussin DM. Over the weekend her cough has become worse although there is no color.  She wants to know if you can call in a cough medicine for her.  Tussionex [employer] is what you called in for her before.

## 2013-01-25 NOTE — Telephone Encounter (Signed)
rx faxed to pharmacy, patient is aware

## 2013-01-26 ENCOUNTER — Other Ambulatory Visit: Payer: Self-pay | Admitting: *Deleted

## 2013-01-26 ENCOUNTER — Telehealth: Payer: Self-pay | Admitting: *Deleted

## 2013-01-26 MED ORDER — GUAIFENESIN-CODEINE 100-10 MG/5ML PO SOLN: ORAL | Status: DC

## 2013-01-26 NOTE — Telephone Encounter (Signed)
She can try Robitussin with codeine, 2 teaspoons as needed every 6 hours

## 2013-01-26 NOTE — Telephone Encounter (Signed)
Pt called about her cough medicine that was called in, she said it's out of stock at the pharmacy and wants to know if we can call in some tablets instead?

## 2013-03-14 ENCOUNTER — Other Ambulatory Visit: Payer: Medicare Other

## 2013-03-17 ENCOUNTER — Ambulatory Visit: Payer: Medicare Other | Admitting: Endocrinology

## 2013-03-23 ENCOUNTER — Encounter: Payer: Self-pay | Admitting: Podiatrist

## 2013-03-23 ENCOUNTER — Ambulatory Visit (INDEPENDENT_AMBULATORY_CARE_PROVIDER_SITE_OTHER): Payer: Medicare Other | Admitting: Podiatrist

## 2013-03-23 ENCOUNTER — Ambulatory Visit: Payer: Medicare Other | Admitting: Podiatrist

## 2013-03-23 VITALS — BP 147/74 | HR 83 | Resp 16

## 2013-03-23 DIAGNOSIS — B351 Tinea unguium: Secondary | ICD-10-CM

## 2013-03-23 DIAGNOSIS — M79609 Pain in unspecified limb: Secondary | ICD-10-CM

## 2013-03-23 NOTE — Patient Instructions (Signed)
Diabetes and Foot Care Diabetes may cause you to have problems because of poor blood supply (circulation) to your feet and legs. This may cause the skin on your feet to become thinner, break easier, and heal more slowly. Your skin may become dry, and the skin may peel and crack. You may also have nerve damage in your legs and feet causing decreased feeling in them. You may not notice minor injuries to your feet that could lead to infections or more serious problems. Taking care of your feet is one of the most important things you can do for yourself.  HOME CARE INSTRUCTIONS  Wear shoes at all times, even in the house. Do not go barefoot. Bare feet are easily injured.  Check your feet daily for blisters, cuts, and redness. If you cannot see the bottom of your feet, use a mirror or ask someone for help.  Wash your feet with warm water (do not use hot water) and mild soap. Then pat your feet and the areas between your toes until they are completely dry. Do not soak your feet as this can dry your skin.  Apply a moisturizing lotion or petroleum jelly (that does not contain alcohol and is unscented) to the skin on your feet and to dry, brittle toenails. Do not apply lotion between your toes.  Trim your toenails straight across. Do not dig under them or around the cuticle. File the edges of your nails with an emery board or nail file.  Do not cut corns or calluses or try to remove them with medicine.  Wear clean socks or stockings every day. Make sure they are not too tight. Do not wear knee-high stockings since they may decrease blood flow to your legs.  Wear shoes that fit properly and have enough cushioning. To break in new shoes, wear them for just a few hours a day. This prevents you from injuring your feet. Always look in your shoes before you put them on to be sure there are no objects inside.  Do not cross your legs. This may decrease the blood flow to your feet.  If you find a minor scrape,  cut, or break in the skin on your feet, keep it and the skin around it clean and dry. These areas may be cleansed with mild soap and water. Do not cleanse the area with peroxide, alcohol, or iodine.  When you remove an adhesive bandage, be sure not to damage the skin around it.  If you have a wound, look at it several times a day to make sure it is healing.  Do not use heating pads or hot water bottles. They may burn your skin. If you have lost feeling in your feet or legs, you may not know it is happening until it is too late.  Make sure your health care provider performs a complete foot exam at least annually or more often if you have foot problems. Report any cuts, sores, or bruises to your health care provider immediately. SEEK MEDICAL CARE IF:   You have an injury that is not healing.  You have cuts or breaks in the skin.  You have an ingrown nail.  You notice redness on your legs or feet.  You feel burning or tingling in your legs or feet.  You have pain or cramps in your legs and feet.  Your legs or feet are numb.  Your feet always feel cold. SEEK IMMEDIATE MEDICAL CARE IF:   There is increasing redness,   swelling, or pain in or around a wound.  There is a red line that goes up your leg.  Pus is coming from a wound.  You develop a fever or as directed by your health care provider.  You notice a bad smell coming from an ulcer or wound. Document Released: 01/04/2000 Document Revised: 09/08/2012 Document Reviewed: 06/15/2012 [employer] Patient Information 2014 [employer].  

## 2013-03-24 NOTE — Progress Notes (Signed)
HPI: Patient presents today for follow up of diabetic foot and nail care. Past medical history, meds, and allergies reviewed. Patient states blood sugar is under good  control.   Objective:  Neurovascular status unchanged with palpable pedal pulses at 2 out of 4 dp and pt   neurological exam reveals protective sensation to be  Decreased at distal digits via Semmes Weinstein monofilament at 2/5 sites bilateral..     Toenails are elongated, incurvated, discolored, dystrophic with ingrown deformity present.    Assessment: Diabetes with Neuropathy, Ingrown nail deformity,  Plan: Discussed treatment options and alternatives. Debrided nails without complication.  Return appointment recommended at routine intervals of 3 months.   Beverly AschoffKathryn Shaquaya Carlson, DPM

## 2013-04-08 ENCOUNTER — Other Ambulatory Visit: Payer: Self-pay | Admitting: *Deleted

## 2013-04-08 MED ORDER — "INSULIN SYRINGE-NEEDLE U-100 31G X 5/16"" 0.5 ML MISC": Status: DC

## 2013-05-04 ENCOUNTER — Other Ambulatory Visit: Payer: Self-pay | Admitting: *Deleted

## 2013-05-04 MED ORDER — BISOPROLOL-HYDROCHLOROTHIAZIDE 10-6.25 MG PO TABS: 1.0000 | ORAL_TABLET | Freq: Every day | ORAL | Status: DC

## 2013-05-04 MED ORDER — RAMIPRIL 10 MG PO CAPS: ORAL_CAPSULE | ORAL | Status: DC

## 2013-05-04 MED ORDER — METFORMIN HCL ER 500 MG PO TB24: ORAL_TABLET | ORAL | Status: DC

## 2013-05-16 ENCOUNTER — Other Ambulatory Visit: Payer: Medicare Other

## 2013-05-19 ENCOUNTER — Ambulatory Visit: Payer: Medicare Other | Admitting: Endocrinology

## 2013-05-31 ENCOUNTER — Telehealth: Payer: Self-pay | Admitting: Endocrinology

## 2013-05-31 NOTE — Telephone Encounter (Signed)
Patient would like to speak with you in regards to a RX  Thank You :)

## 2013-06-01 ENCOUNTER — Other Ambulatory Visit: Payer: Self-pay | Admitting: *Deleted

## 2013-06-03 ENCOUNTER — Other Ambulatory Visit: Payer: Self-pay | Admitting: *Deleted

## 2013-06-03 MED ORDER — "INSULIN SYRINGE-NEEDLE U-100 31G X 5/16"" 0.5 ML MISC": Status: DC

## 2013-06-14 ENCOUNTER — Other Ambulatory Visit: Payer: Self-pay | Admitting: *Deleted

## 2013-06-14 MED ORDER — BISOPROLOL-HYDROCHLOROTHIAZIDE 10-6.25 MG PO TABS: 1.0000 | ORAL_TABLET | Freq: Every day | ORAL | Status: DC

## 2013-06-16 ENCOUNTER — Ambulatory Visit: Payer: Medicare Other | Admitting: Podiatrist

## 2013-06-20 ENCOUNTER — Other Ambulatory Visit: Payer: Self-pay | Admitting: *Deleted

## 2013-06-20 MED ORDER — GLUCOSE BLOOD VI STRP: ORAL_STRIP | Status: DC

## 2013-06-23 ENCOUNTER — Ambulatory Visit: Payer: Medicare Other | Admitting: Podiatrist

## 2013-06-27 ENCOUNTER — Ambulatory Visit (INDEPENDENT_AMBULATORY_CARE_PROVIDER_SITE_OTHER): Payer: Medicare Other | Admitting: Podiatrist

## 2013-06-27 ENCOUNTER — Encounter: Payer: Self-pay | Admitting: Podiatrist

## 2013-06-27 VITALS — BP 132/74 | HR 74 | Resp 12

## 2013-06-27 DIAGNOSIS — E119 Type 2 diabetes mellitus without complications: Secondary | ICD-10-CM

## 2013-06-27 DIAGNOSIS — B351 Tinea unguium: Secondary | ICD-10-CM

## 2013-06-27 DIAGNOSIS — M79609 Pain in unspecified limb: Secondary | ICD-10-CM

## 2013-06-27 NOTE — Progress Notes (Signed)
HPI: Patient presents today for follow up of diabetic foot and nail care. Past medical history, meds, and allergies reviewed. Patient states blood sugar is under good control.  Objective: Neurovascular status unchanged with palpable pedal pulses at 2 out of 4 dp and pt  neurological exam reveals protective sensation to be Decreased at distal digits via Semmes Weinstein monofilament at 2/5 sites bilateral.. Toenails are elongated, incurvated, discolored, dystrophic with ingrown deformity present.  Assessment: Diabetes with Neuropathy, Ingrown nail deformity,  Plan: Discussed treatment options and alternatives. Debrided nails without complication. Return appointment recommended at routine intervals of 3 months.

## 2013-07-11 ENCOUNTER — Other Ambulatory Visit: Payer: Medicare Other

## 2013-07-14 ENCOUNTER — Ambulatory Visit: Payer: Medicare Other | Admitting: Endocrinology

## 2013-07-21 ENCOUNTER — Other Ambulatory Visit: Payer: Self-pay | Admitting: Endocrinology

## 2013-07-30 ENCOUNTER — Other Ambulatory Visit: Payer: Self-pay | Admitting: Endocrinology

## 2013-08-01 ENCOUNTER — Other Ambulatory Visit: Payer: Self-pay | Admitting: Endocrinology

## 2013-08-01 MED ORDER — INSULIN GLARGINE 100 UNIT/ML ~~LOC~~ SOLN: SUBCUTANEOUS | Status: DC

## 2013-08-29 ENCOUNTER — Other Ambulatory Visit: Payer: Medicare Other

## 2013-08-29 ENCOUNTER — Other Ambulatory Visit: Payer: Self-pay | Admitting: *Deleted

## 2013-08-29 MED ORDER — COLESEVELAM HCL 625 MG PO TABS: ORAL_TABLET | ORAL | Status: DC

## 2013-09-02 ENCOUNTER — Ambulatory Visit: Payer: Medicare Other | Admitting: Endocrinology

## 2013-09-29 ENCOUNTER — Encounter: Payer: Self-pay | Admitting: Podiatrist

## 2013-09-29 ENCOUNTER — Ambulatory Visit (INDEPENDENT_AMBULATORY_CARE_PROVIDER_SITE_OTHER): Payer: Medicare Other | Admitting: Podiatrist

## 2013-09-29 DIAGNOSIS — M79676 Pain in unspecified toe(s): Principal | ICD-10-CM

## 2013-09-29 DIAGNOSIS — M79609 Pain in unspecified limb: Secondary | ICD-10-CM

## 2013-09-29 DIAGNOSIS — B351 Tinea unguium: Secondary | ICD-10-CM

## 2013-09-29 NOTE — Progress Notes (Signed)
HPI: Patient presents today for follow up of diabetic foot and nail care. Past medical history, meds, and allergies reviewed. Patient states blood sugar is under good control.  Objective: Neurovascular status unchanged with palpable pedal pulses at 2 out of 4 dp and pt  neurological exam reveals protective sensation to be Decreased at distal digits via Semmes Weinstein monofilament at 2/5 sites bilateral.. Toenails are elongated, incurvated, discolored, dystrophic with ingrown deformity present.  Assessment: Diabetes with Neuropathy, Ingrown nail deformity,  Plan: Discussed treatment options and alternatives. Debrided nails without complication. Return appointment recommended at routine intervals of 3 months  

## 2013-10-17 ENCOUNTER — Other Ambulatory Visit: Payer: Self-pay | Admitting: Endocrinology

## 2013-10-17 ENCOUNTER — Other Ambulatory Visit: Payer: Medicare Other

## 2013-10-18 LAB — BASIC METABOLIC PANEL
BUN: 27 mg/dL — AB (ref 6–23)
CALCIUM: 9.3 mg/dL (ref 8.4–10.5)
CO2: 25 mEq/L (ref 19–32)
Chloride: 101 mEq/L (ref 96–112)
Creat: 0.88 mg/dL (ref 0.50–1.10)
GLUCOSE: 129 mg/dL — AB (ref 70–99)
POTASSIUM: 5.3 meq/L (ref 3.5–5.3)
Sodium: 137 mEq/L (ref 135–145)

## 2013-10-18 LAB — HEMOGLOBIN A1C
Hgb A1c MFr Bld: 7.7 % — ABNORMAL HIGH (ref <5.7)
Mean Plasma Glucose: 174 mg/dL — ABNORMAL HIGH (ref <117)

## 2013-10-20 ENCOUNTER — Ambulatory Visit (INDEPENDENT_AMBULATORY_CARE_PROVIDER_SITE_OTHER): Payer: Medicare Other | Admitting: Endocrinology

## 2013-10-20 ENCOUNTER — Encounter: Payer: Self-pay | Admitting: Endocrinology

## 2013-10-20 VITALS — BP 128/72 | HR 92 | Temp 98.6°F | Resp 16 | Ht 66.25 in | Wt 235.0 lb

## 2013-10-20 DIAGNOSIS — E7800 Pure hypercholesterolemia, unspecified: Secondary | ICD-10-CM

## 2013-10-20 DIAGNOSIS — I10 Essential (primary) hypertension: Secondary | ICD-10-CM | POA: Diagnosis not present

## 2013-10-20 DIAGNOSIS — Z23 Encounter for immunization: Secondary | ICD-10-CM

## 2013-10-20 DIAGNOSIS — E1140 Type 2 diabetes mellitus with diabetic neuropathy, unspecified: Secondary | ICD-10-CM | POA: Diagnosis not present

## 2013-10-20 NOTE — Progress Notes (Signed)
Patient ID: Beverly Carlson, female   DOB: Jun 01, 1945, 68 y.o.   MRN: [id number]   Reason for Appointment: Diabetes follow-up   History of Present Illness   Diagnosis: Type 2 DIABETES MELITUS, date of diagnosis:1992      Previous history: She was initially treated with metformin and subsequently glyburide. In 2002 insulin was added She has been on basal bolus insulin regimen with metformin with variable control over the last few years She is generally noncompliant with followup and glucose monitoring  Recent history: She has not been seen in followup since 11/14 At that time her A1c was 7.7 and it is the same now, previously had been 6.7% Again has not brought her monitor for download and unable to determine why her A1c is higher She thinks her blood sugars are all close to normal including some readings after meals She is however having difficulty taking her insulin at mealtimes when she is not at home because she is using a syringe However she has gained weight since then. A1c is not available today However has lost a little weight since last year     Oral hypoglycemic drugs:        Side effects from medications: None Insulin regimen: 50  Lantus at bedtime. NovoLog   16 before lunch and 28 with supper       Proper timing of medications in relation to meals: Yes.          Monitors blood glucose: 3/7 days  Glucometer:  FreeStyle         Blood Glucose readings from recall: readings before breakfast:  97 PC 133-145   Hypoglycemia frequency:  none         Meals:  2 meals per day.          Physical activity: exercise: walking 2/7 days a week Dietitian visit: 2002    Weight control:  Wt Readings from Last 3 Encounters:  10/20/13 235 lb (106.595 kg)  12/13/12 241 lb 6.4 oz (109.498 kg)  01/09/11 236 lb 6.4 oz (107.23 kg)          Complications:  none. Has past history of femoral neuropathy     Diabetes labs:  Lab Results  Component Value Date   HGBA1C 7.7* 10/17/2013   HGBA1C 7.7* 12/13/2012   No results found for this basename: GLUF,  MICROALBUR,  LDLCALC,  CREATININE      Medication List       This list is accurate as of: 10/20/13 11:51 AM.  Always use your most recent med list.               bisoprolol-hydrochlorothiazide 10-6.25 MG per tablet  Commonly known as:  ZIAC  Take 1 tablet by mouth daily.     colesevelam 625 MG tablet  Commonly known as:  WELCHOL  Take 3 tablets twice a day     furosemide 10 MG/ML solution  Commonly known as:  LASIX  Take by mouth daily.     glucose blood test strip  Commonly known as:  FREESTYLE LITE  Use as instructed to check blood sugar 2 times per day dx code 250.00     insulin glargine 100 UNIT/ML injection  Commonly known as:  LANTUS  INJECT 0.5 MLS (50 UNITS TOTAL) INTO THE SKIN AT BEDTIME.     Insulin Syringe-Needle U-100 31G X 5/16" 0.5 ML Misc  Commonly known as:  BD INSULIN SYRINGE ULTRAFINE  Use 3 syringes per day  metFORMIN 500 MG 24 hr tablet  Commonly known as:  GLUCOPHAGE-XR  Take 2 tablets twice a day     NOVOLOG 100 UNIT/ML injection  Generic drug:  insulin aspart  INJECT 16 UNITS IN THE MORNING AND 28 UNITS IN THE EVENING     predniSONE 10 MG tablet  Commonly known as:  DELTASONE  2.5 mg Daily.     ramipril 10 MG capsule  Commonly known as:  ALTACE  Take one tablet daily        Allergies:  Allergies  Allergen Reactions  . Codeine Anxiety    Past Medical History  Diagnosis Date  . Diabetes mellitus   . Hypertension   . Bilateral swelling of feet   . Arthritis   . Incontinence   . Thyroid disease     Past Surgical History  Procedure Laterality Date  . Back surgery  2003  . Abdominal hysterectomy  1982    partial    Family History  Problem Relation Age of Onset  . Diabetes Mother   . Heart disease Mother   . Mental illness Father     dementia  . Diabetes Sister   . Diabetes Sister   . Heart disease Sister     Social History:  reports that  she quit smoking about 34 years ago. She has never used smokeless tobacco. She reports that she does not drink alcohol or use illicit drugs.  Review of Systems:  Hypertension:  currently well controlled even without taking any Lasix  Lipids: Not well controlled because of not taking statins. She was asked to check with her neurologist if we could use statin drugs with history of polymyositis and she has not determined this   Edema Lasix: Much better now and she has not needed any diuretics  Inflammatory [facility]. She has been on prednisone 2.5 mg    No numbness or tingling in her feet   Examination:   BP 128/72  Pulse 92  Temp(Src) 98.6 F (37 C)  Resp 16  Ht 5' 6.25" (1.683 m)  Wt 235 lb (106.595 kg)  BMI 37.63 kg/m2  SpO2 96%  Body mass index is 37.63 kg/(m^2).   Diabetic foot exam shows normal monofilament sensation in the toes and plantar surfaces, no skin lesions or ulcers on the feet, no ankle edema and normal pedal pulses. Mild puffiness of the feet present  ASSESSMENT/ PLAN:   1. Diabetes type 2  Blood glucose control will need to be assessed better with frequent glucose monitoring and review of home download Although she has lost a little weight but starting a walking program she can increase her exercise more She is requesting an insulin pen for mealtime insulin and we can switch Again discussed better compliance with the above measures may need to bring monitor for download Will see her in 3-4 months for followup  2. HYPERTENSION: Well controlled now   3. Lipids to be checked. She will try to find out from neurologist if she can take a statin drug now since she did not have statin-related myopathy   4. Dependent pedal edema, not a problem now and not taking diuretics  Influenza vaccine given  Kiyani Jernigan 10/20/2013, 11:51 AM

## 2013-10-20 NOTE — Patient Instructions (Addendum)
Please check blood sugars at least half the time about 2 hours after any meal and times per week on waking up. Please bring blood sugar monitor to each visit  Walk daily 

## 2013-11-08 ENCOUNTER — Other Ambulatory Visit: Payer: Self-pay | Admitting: Endocrinology

## 2013-11-29 ENCOUNTER — Emergency Department (HOSPITAL_COMMUNITY): Payer: No Typology Code available for payment source

## 2013-11-29 ENCOUNTER — Encounter (HOSPITAL_COMMUNITY): Payer: Self-pay

## 2013-11-29 ENCOUNTER — Emergency Department (HOSPITAL_COMMUNITY)
Admission: EM | Admit: 2013-11-29 | Discharge: 2013-11-29 | Disposition: A | Discharge status: Home / Self Care | Payer: No Typology Code available for payment source | Attending: Emergency Medicine | Admitting: Emergency Medicine

## 2013-11-29 DIAGNOSIS — M25532 Pain in left wrist: Secondary | ICD-10-CM

## 2013-11-29 DIAGNOSIS — S299XXA Unspecified injury of thorax, initial encounter: Secondary | ICD-10-CM | POA: Insufficient documentation

## 2013-11-29 DIAGNOSIS — Z794 Long term (current) use of insulin: Secondary | ICD-10-CM | POA: Insufficient documentation

## 2013-11-29 DIAGNOSIS — Y9389 Activity, other specified: Secondary | ICD-10-CM | POA: Insufficient documentation

## 2013-11-29 DIAGNOSIS — I10 Essential (primary) hypertension: Secondary | ICD-10-CM | POA: Insufficient documentation

## 2013-11-29 DIAGNOSIS — R079 Chest pain, unspecified: Secondary | ICD-10-CM | POA: Insufficient documentation

## 2013-11-29 DIAGNOSIS — M1990 Unspecified osteoarthritis, unspecified site: Secondary | ICD-10-CM | POA: Diagnosis not present

## 2013-11-29 DIAGNOSIS — E119 Type 2 diabetes mellitus without complications: Secondary | ICD-10-CM | POA: Insufficient documentation

## 2013-11-29 DIAGNOSIS — S3991XA Unspecified injury of abdomen, initial encounter: Secondary | ICD-10-CM | POA: Insufficient documentation

## 2013-11-29 DIAGNOSIS — Y9289 Other specified places as the place of occurrence of the external cause: Secondary | ICD-10-CM | POA: Diagnosis not present

## 2013-11-29 DIAGNOSIS — Z7952 Long term (current) use of systemic steroids: Secondary | ICD-10-CM | POA: Diagnosis not present

## 2013-11-29 DIAGNOSIS — Z7982 Long term (current) use of aspirin: Secondary | ICD-10-CM | POA: Insufficient documentation

## 2013-11-29 DIAGNOSIS — Z79899 Other long term (current) drug therapy: Secondary | ICD-10-CM | POA: Insufficient documentation

## 2013-11-29 DIAGNOSIS — Z87891 Personal history of nicotine dependence: Secondary | ICD-10-CM | POA: Diagnosis not present

## 2013-11-29 DIAGNOSIS — Y999 Unspecified external cause status: Secondary | ICD-10-CM | POA: Diagnosis not present

## 2013-11-29 DIAGNOSIS — S6992XA Unspecified injury of left wrist, hand and finger(s), initial encounter: Secondary | ICD-10-CM | POA: Diagnosis not present

## 2013-11-29 DIAGNOSIS — R1012 Left upper quadrant pain: Secondary | ICD-10-CM

## 2013-11-29 LAB — I-STAT CHEM 8, ED
BUN: 29 mg/dL — ABNORMAL HIGH (ref 6–23)
CHLORIDE: 108 meq/L (ref 96–112)
CREATININE: 0.9 mg/dL (ref 0.50–1.10)
Calcium, Ion: 1.21 mmol/L (ref 1.13–1.30)
Glucose, Bld: 127 mg/dL — ABNORMAL HIGH (ref 70–99)
HEMATOCRIT: 46 % (ref 36.0–46.0)
Hemoglobin: 15.6 g/dL — ABNORMAL HIGH (ref 12.0–15.0)
POTASSIUM: 5.1 meq/L (ref 3.7–5.3)
Sodium: 139 mEq/L (ref 137–147)
TCO2: 24 mmol/L (ref 0–100)

## 2013-11-29 LAB — CBC WITH DIFFERENTIAL/PLATELET
Basophils Absolute: 0 10*3/uL (ref 0.0–0.1)
Basophils Relative: 0 % (ref 0–1)
Eosinophils Absolute: 0.1 10*3/uL (ref 0.0–0.7)
Eosinophils Relative: 2 % (ref 0–5)
HEMATOCRIT: 43.1 % (ref 36.0–46.0)
HEMOGLOBIN: 13.7 g/dL (ref 12.0–15.0)
Lymphocytes Relative: 27 % (ref 12–46)
Lymphs Abs: 1.3 10*3/uL (ref 0.7–4.0)
MCH: 28.9 pg (ref 26.0–34.0)
MCHC: 31.8 g/dL (ref 30.0–36.0)
MCV: 90.9 fL (ref 78.0–100.0)
MONO ABS: 0.3 10*3/uL (ref 0.1–1.0)
MONOS PCT: 6 % (ref 3–12)
NEUTROS PCT: 65 % (ref 43–77)
Neutro Abs: 3.3 10*3/uL (ref 1.7–7.7)
Platelets: 252 10*3/uL (ref 150–400)
RBC: 4.74 MIL/uL (ref 3.87–5.11)
RDW: 13.5 % (ref 11.5–15.5)
WBC: 5 10*3/uL (ref 4.0–10.5)

## 2013-11-29 LAB — BASIC METABOLIC PANEL
Anion gap: 15 (ref 5–15)
BUN: 21 mg/dL (ref 6–23)
CO2: 21 meq/L (ref 19–32)
Calcium: 9.5 mg/dL (ref 8.4–10.5)
Chloride: 102 mEq/L (ref 96–112)
Creatinine, Ser: 0.77 mg/dL (ref 0.50–1.10)
GFR calc Af Amer: >90 mL/min (ref >90)
GFR calc non Af Amer: 84 mL/min — ABNORMAL LOW (ref >90)
GLUCOSE: 126 mg/dL — AB (ref 70–99)
POTASSIUM: 5.2 meq/L (ref 3.7–5.3)
Sodium: 138 mEq/L (ref 137–147)

## 2013-11-29 MED ORDER — OXYCODONE-ACETAMINOPHEN 5-325 MG PO TABS
1.0000 | ORAL_TABLET | Freq: Once | ORAL | Status: AC
  Administered 2013-11-29: 1 via ORAL
  Filled 2013-11-29: qty 1

## 2013-11-29 MED ORDER — IOHEXOL 300 MG/ML  SOLN
100.0000 mL | Freq: Once | INTRAMUSCULAR | Status: AC | PRN
  Administered 2013-11-29: 100 mL via INTRAVENOUS

## 2013-11-29 MED ORDER — HYDROCODONE-ACETAMINOPHEN 5-325 MG PO TABS: ORAL_TABLET | ORAL | Status: DC

## 2013-11-29 MED ORDER — MORPHINE SULFATE 4 MG/ML IJ SOLN
4.0000 mg | INTRAMUSCULAR | Status: DC | PRN
  Administered 2013-11-29: 4 mg via INTRAVENOUS
  Filled 2013-11-29: qty 1

## 2013-11-29 MED ORDER — ONDANSETRON HCL 4 MG/2ML IJ SOLN
4.0000 mg | Freq: Once | INTRAMUSCULAR | Status: AC
  Administered 2013-11-29: 4 mg via INTRAVENOUS
  Filled 2013-11-29: qty 2

## 2013-11-29 NOTE — Discharge Instructions (Signed)
Take vicodin for breakthrough pain, do not drink alcohol, drive, care for children or do other critical tasks while taking vicodin.  Only use the arm sling for up to 2 days. Take the arm out and rotate the shoulder every 4 hours.   Please be very careful not to fall! The pain medication and pain puts you at risk for falls. Please rest as much as possible and try to not stay alone.   Please follow with your primary care doctor in the next 2 days for a check-up. They must obtain records for further management.   Do not hesitate to return to the Emergency Department for any new, worsening or concerning symptoms.
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
## 2013-11-29 NOTE — ED Notes (Signed)
Ortho paged, will come to apply wrist splint.
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
## 2013-11-29 NOTE — ED Notes (Signed)
Patient declining medication until she is able to write a check for her daughter. PT also declines to change into gown.  Explained process of care to patient.

## 2013-11-29 NOTE — Progress Notes (Signed)
Orthopedic Tech Progress Note Patient Details:  Beverly Carlson 10/11/1945 [id number]  Ortho Devices Type of Ortho Device: Ace wrap, Thumb spica splint, Arm sling Splint Material: Fiberglass Ortho Device/Splint Location: LUE Ortho Device/Splint Interventions: Ordered, Application   Jennye MoccasinHughes, Ryli Standlee Craig 11/29/2013, 3:48 PM

## 2013-12-21 ENCOUNTER — Ambulatory Visit: Payer: Medicare Other | Admitting: Podiatrist

## 2014-01-03 ENCOUNTER — Telehealth: Payer: Self-pay | Admitting: Endocrinology

## 2014-01-03 ENCOUNTER — Other Ambulatory Visit: Payer: Self-pay | Admitting: *Deleted

## 2014-01-03 DIAGNOSIS — M3320 Polymyositis, organ involvement unspecified: Secondary | ICD-10-CM

## 2014-01-03 NOTE — Telephone Encounter (Signed)
Please see below and advise if ok to order?

## 2014-01-03 NOTE — Telephone Encounter (Signed)
Patient need order put in for Creatine Kinase ordered from [facility] [employer] [facility], she wants it drawn here, please advise

## 2014-01-03 NOTE — Telephone Encounter (Signed)
Ordered

## 2014-01-03 NOTE — Telephone Encounter (Signed)
Diagnosis polymyositis, order total CPK only

## 2014-01-04 ENCOUNTER — Ambulatory Visit (INDEPENDENT_AMBULATORY_CARE_PROVIDER_SITE_OTHER): Payer: Medicare Other | Admitting: Podiatrist

## 2014-01-04 ENCOUNTER — Encounter: Payer: Self-pay | Admitting: Podiatrist

## 2014-01-04 DIAGNOSIS — M79676 Pain in unspecified toe(s): Secondary | ICD-10-CM

## 2014-01-04 DIAGNOSIS — B351 Tinea unguium: Secondary | ICD-10-CM

## 2014-01-04 DIAGNOSIS — E1140 Type 2 diabetes mellitus with diabetic neuropathy, unspecified: Secondary | ICD-10-CM

## 2014-01-04 NOTE — Progress Notes (Signed)
HPI: Patient presents today for follow up of diabetic foot and nail care. Past medical history, meds, and allergies reviewed. Patient states blood sugar is under good control.  Objective: Neurovascular status unchanged with palpable pedal pulses at 2 out of 4 dp and pt  neurological exam reveals protective sensation to be Decreased at distal digits via Semmes Weinstein monofilament at 2/5 sites bilateral.. Toenails are elongated, incurvated, discolored, dystrophic with ingrown deformity present.  Assessment: Diabetes with Neuropathy, Ingrown nail deformity,  Plan: Discussed treatment options and alternatives. Debrided nails without complication. Return appointment recommended at routine intervals of 3 months

## 2014-01-09 ENCOUNTER — Other Ambulatory Visit: Payer: Medicare Other

## 2014-02-07 ENCOUNTER — Other Ambulatory Visit: Payer: Self-pay | Admitting: Endocrinology

## 2014-02-14 ENCOUNTER — Other Ambulatory Visit: Payer: Self-pay | Admitting: Endocrinology

## 2014-02-15 ENCOUNTER — Other Ambulatory Visit: Payer: Self-pay | Admitting: *Deleted

## 2014-02-15 MED ORDER — "INSULIN SYRINGE-NEEDLE U-100 31G X 5/16"" 1 ML MISC": Status: DC

## 2014-02-16 ENCOUNTER — Telehealth: Payer: Self-pay | Admitting: *Deleted

## 2014-02-16 ENCOUNTER — Other Ambulatory Visit: Payer: Self-pay | Admitting: Endocrinology

## 2014-02-16 DIAGNOSIS — F411 Generalized anxiety disorder: Secondary | ICD-10-CM
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
## 2014-02-16 NOTE — Telephone Encounter (Signed)
Referral made 

## 2014-02-20 ENCOUNTER — Other Ambulatory Visit: Payer: Medicare Other

## 2014-02-23 ENCOUNTER — Ambulatory Visit: Payer: Medicare Other | Admitting: Endocrinology

## 2014-03-30 ENCOUNTER — Other Ambulatory Visit: Payer: Self-pay | Admitting: *Deleted

## 2014-03-30 MED ORDER — INSULIN PEN NEEDLE 32G X 4 MM MISC: Status: DC

## 2014-03-30 MED ORDER — INSULIN ASPART 100 UNIT/ML FLEXPEN: PEN_INJECTOR | SUBCUTANEOUS | Status: AC

## 2014-04-06 ENCOUNTER — Ambulatory Visit: Payer: Medicare Other | Admitting: Podiatrist

## 2014-04-10 ENCOUNTER — Other Ambulatory Visit: Payer: Medicare Other

## 2014-04-12 ENCOUNTER — Ambulatory Visit: Payer: Medicare Other | Admitting: Endocrinology

## 2014-04-24 ENCOUNTER — Other Ambulatory Visit: Payer: Self-pay | Admitting: *Deleted

## 2014-04-24 MED ORDER — GLUCOSE BLOOD VI STRP: ORAL_STRIP | Status: DC

## 2014-04-25 ENCOUNTER — Other Ambulatory Visit: Payer: Self-pay | Admitting: *Deleted

## 2014-04-25 MED ORDER — "INSULIN SYRINGE-NEEDLE U-100 31G X 5/16"" 0.5 ML MISC": Status: DC

## 2014-05-04 ENCOUNTER — Ambulatory Visit: Payer: Medicare Other | Admitting: Podiatrist

## 2014-05-08 ENCOUNTER — Other Ambulatory Visit: Payer: Self-pay | Admitting: Endocrinology

## 2014-05-24 ENCOUNTER — Other Ambulatory Visit: Payer: Self-pay | Admitting: *Deleted

## 2014-05-24 MED ORDER — BISOPROLOL-HYDROCHLOROTHIAZIDE 10-6.25 MG PO TABS: 1.0000 | ORAL_TABLET | Freq: Every day | ORAL | Status: DC

## 2014-05-25 ENCOUNTER — Ambulatory Visit (INDEPENDENT_AMBULATORY_CARE_PROVIDER_SITE_OTHER): Payer: Medicare Other

## 2014-05-25 DIAGNOSIS — M79676 Pain in unspecified toe(s): Secondary | ICD-10-CM

## 2014-05-25 DIAGNOSIS — B351 Tinea unguium: Secondary | ICD-10-CM

## 2014-05-26 NOTE — Progress Notes (Signed)
HPI: Patient presents today for follow up of diabetic foot and nail care. Past medical history, meds, and allergies reviewed. Patient states blood sugar is under good control.  Objective: Neurovascular status unchanged with palpable pedal pulses at 2 out of 4 dp and pt  neurological exam reveals protective sensation to be Decreased at distal digits via Semmes Weinstein monofilament at 2/5 sites bilateral.. Toenails are elongated, incurvated, discolored, dystrophic with ingrown deformity present.  Assessment: Diabetes with Neuropathy, Ingrown nail deformity,  Plan: Discussed treatment options and alternatives. Debrided nails without complication. Return appointment recommended at routine intervals of 3 months

## 2014-05-30 ENCOUNTER — Other Ambulatory Visit: Payer: Self-pay | Admitting: *Deleted

## 2014-05-30 MED ORDER — BISOPROLOL-HYDROCHLOROTHIAZIDE 10-6.25 MG PO TABS: 1.0000 | ORAL_TABLET | Freq: Every day | ORAL | Status: DC

## 2014-07-04 ENCOUNTER — Telehealth: Payer: Self-pay | Admitting: *Deleted

## 2014-07-04 NOTE — Telephone Encounter (Signed)
Left detailed message on patients vm

## 2014-07-04 NOTE — Telephone Encounter (Signed)
Patient called, she said she has no appetite and is concerned about taking her insulin.  She wants to know if 1. She should lower her insulin dose or 2. If there is a liquid supplement she can take and still take her required dose of Insulin?  Please advise.

## 2014-07-04 NOTE — Telephone Encounter (Signed)
Depends on her blood sugar.  Needs to check more often and make sure she brings her monitor for review on the next visit.  She can reduce her insulin by at least 4 units if not eating much.  Recommend that she establish with a primary care physician to evaluate why she has decreased appetite

## 2014-07-11 ENCOUNTER — Ambulatory Visit (INDEPENDENT_AMBULATORY_CARE_PROVIDER_SITE_OTHER): Payer: Medicare Other | Admitting: Family Medicine

## 2014-07-11 VITALS — BP 100/58 | HR 80 | Temp 98.8°F | Resp 16 | Ht 65.75 in | Wt 202.0 lb

## 2014-07-11 DIAGNOSIS — K219 Gastro-esophageal reflux disease without esophagitis: Secondary | ICD-10-CM

## 2014-07-11 DIAGNOSIS — R634 Abnormal weight loss: Secondary | ICD-10-CM

## 2014-07-11 DIAGNOSIS — R630 Anorexia: Secondary | ICD-10-CM

## 2014-07-11 DIAGNOSIS — K30 Functional dyspepsia: Secondary | ICD-10-CM | POA: Diagnosis not present

## 2014-07-11 DIAGNOSIS — E11649 Type 2 diabetes mellitus with hypoglycemia without coma: Secondary | ICD-10-CM | POA: Diagnosis not present

## 2014-07-11 DIAGNOSIS — B373 Candidiasis of vulva and vagina: Secondary | ICD-10-CM

## 2014-07-11 DIAGNOSIS — R0981 Nasal congestion: Secondary | ICD-10-CM | POA: Diagnosis not present

## 2014-07-11 DIAGNOSIS — E118 Type 2 diabetes mellitus with unspecified complications: Secondary | ICD-10-CM | POA: Diagnosis not present

## 2014-07-11 DIAGNOSIS — Z87898 Personal history of other specified conditions: Secondary | ICD-10-CM

## 2014-07-11 DIAGNOSIS — Z8709 Personal history of other diseases of the respiratory system: Secondary | ICD-10-CM

## 2014-07-11 DIAGNOSIS — B3731 Acute candidiasis of vulva and vagina: Secondary | ICD-10-CM

## 2014-07-11 LAB — POCT CBC
Granulocyte percent: 63.2 %G (ref 37–80)
HCT, POC: 40.3 % (ref 37.7–47.9)
Hemoglobin: 13.2 g/dL (ref 12.2–16.2)
Lymph, poc: 2 (ref 0.6–3.4)
MCH: 28.4 pg (ref 27–31.2)
MCHC: 32.8 g/dL (ref 31.8–35.4)
MCV: 86.4 fL (ref 80–97)
MID (cbc): 2.4 — AB (ref 0–0.9)
MPV: 7.8 fL (ref 0–99.8)
PLATELET COUNT, POC: 505 10*3/uL — AB (ref 142–424)
POC GRANULOCYTE: 7.5 — AB (ref 2–6.9)
POC LYMPH PERCENT: 16.4 %L (ref 10–50)
POC MID %: 20.4 % — AB (ref 0–12)
RBC: 4.66 M/uL (ref 4.04–5.48)
RDW, POC: 14.7 %
WBC: 11.9 10*3/uL — AB (ref 4.6–10.2)

## 2014-07-11 LAB — POCT URINALYSIS DIPSTICK
GLUCOSE UA: NEGATIVE
Ketones, UA: 15
Leukocytes, UA: NEGATIVE
Nitrite, UA: NEGATIVE
Protein, UA: 100
Urobilinogen, UA: 0.2
pH, UA: 5

## 2014-07-11 LAB — POCT UA - MICROSCOPIC ONLY
Crystals, Ur, HPF, POC: NEGATIVE
YEAST UA: POSITIVE

## 2014-07-11 LAB — POCT GLYCOSYLATED HEMOGLOBIN (HGB A1C): HEMOGLOBIN A1C: 9.3

## 2014-07-11 LAB — GLUCOSE, POCT (MANUAL RESULT ENTRY): POC Glucose: 248 mg/dl — AB (ref 70–99)

## 2014-07-11 MED ORDER — FLUTICASONE PROPIONATE 50 MCG/ACT NA SUSP: NASAL | Status: AC

## 2014-07-11 MED ORDER — PANTOPRAZOLE SODIUM 40 MG PO TBEC: DELAYED_RELEASE_TABLET | ORAL | Status: DC

## 2014-07-11 MED ORDER — FLUCONAZOLE 150 MG PO TABS: ORAL_TABLET | ORAL | Status: DC

## 2014-07-11 NOTE — Progress Notes (Signed)
Subjective:  Patient ID: Beverly Carlson, female    DOB: [birth year]  Age: 69 y.o. MRN: [id number]  69 year old lady here for her first time. She has a regular internist who sees her for diabetes, Dr. Lucianne Muss. She has spoken with his PA about some of her questions and concerns, and was told to see a primary care doctor which is why she came here. Her problems are back fairly far. Even though the chief complaint is listed as postnasal drainage, her problems are much greater than that. She has over the past 6 months or more, maybe a year, had problems with postnasal drainage. She has congestion. She is aware of self in the back of her throat. She also has with this a gnawing sensation in her stomach and feels like food is going to come up but it has not caused vomiting yet. She doesn't feel like she can eat. She feels like she has no taste for food. She says she has an appetite in her stomach but doesn't have an appetite in her throat. She has been losing weight steadily over the past year, because she cannot eat well. She is diabetic and on insulin and metformin for diabetes. She is on a couple of medications for her blood pressure. She takes well call for her cholesterol. She does not smoke or drink. She says her sugars usually run high, up to 300. She checks her sugar most days.  She just feels weak much of the time. She is not having headaches or dizziness. She did have a spell which sounds like hypoglycemia where she got hot and diaphoretic. She drank some orange shoes and felt better. She denies purulent drainage from her nose, only having some clear watery drainage. Her throat is not sore. No cough or shortness of breath. No chest pain or palpitations. The stomach actually doesn't hurt as much as the gnawing at her stomach. Her bowels do not very much but she says she doesn't eat well. She has never had a colonoscopy. She has history of polymyositis  is on prednisone for that. 2.5 mg daily. She says that she  doesn't hurt as much on that. She does not have a lot of arthritis problems though she has had some problems at times. She says she used to swelling in ankles but she is not currently swelling in the ankles. She lives alone.  Past, family, social, and medical history from the old chart were reviewed. She has not had surgeries except for C-section on her abdomen and a back surgery. Objective:   Pleasant lady alert and oriented. TMs normal. Nose has some swelling of her turbinates. Throat was clear. Neck supple without nodes or thyromegaly. Chest is clear to auscultation. Heart regular without murmurs. Abdomen soft without organomegaly, masses or tenderness. Extremities without edema. Skin is warm and dry. She feels chilled from being in the cool exam room.  Assessment & Plan:   Assessment: Postnasal drainage Type 2 diabetes Gnawing had stomach Weight loss, reason undetermined Anorexia Nasal congestion History of polymyositis on prednisone Possible GERD   Plan: We will check some basic labs on her including glucose, A1c, CMP, CBC, urinalysis.  Results for orders placed or performed in visit on 07/11/14  POCT glucose (manual entry)  Result Value Ref Range   POC Glucose 248 (A) 70 - 99 mg/dl  POCT CBC  Result Value Ref Range   WBC 11.9 (A) 4.6 - 10.2 K/uL   Lymph, poc 2.0 0.6 - 3.4  POC LYMPH PERCENT 16.4 10 - 50 %L   MID (cbc) 2.4 (A) 0 - 0.9   POC MID % 20.4 (A) 0 - 12 %M   POC Granulocyte 7.5 (A) 2 - 6.9   Granulocyte percent 63.2 37 - 80 %G   RBC 4.66 4.04 - 5.48 M/uL   Hemoglobin 13.2 12.2 - 16.2 g/dL   HCT, POC 16.1 09.6 - 47.9 %   MCV 86.4 80 - 97 fL   MCH, POC 28.4 27 - 31.2 pg   MCHC 32.8 31.8 - 35.4 g/dL   RDW, POC 04.5 %   Platelet Count, POC 505 (A) 142 - 424 K/uL   MPV 7.8 0 - 99.8 fL  POCT urinalysis dipstick  Result Value Ref Range   Color, UA yellow    Clarity, UA hazy    Glucose, UA neg    Bilirubin, UA large    Ketones, UA 15    Spec Grav, UA >=1.030     Blood, UA trace    pH, UA 5.0    Protein, UA 100    Urobilinogen, UA 0.2    Nitrite, UA neg    Leukocytes, UA Negative Negative  POCT UA - Microscopic Only  Result Value Ref Range   WBC, Ur, HPF, POC 2-3    RBC, urine, microscopic 0-2    Bacteria, U Microscopic 1+    Mucus, UA 1+    Epithelial cells, urine per micros 2-4    Crystals, Ur, HPF, POC neg    Casts, Ur, LPF, POC hyaline    Yeast, UA positive   POCT glycosylated hemoglobin (Hb A1C)  Result Value Ref Range   Hemoglobin A1C 9.3     Patient is to return in one week  Patient Instructions  Take the fluconazole 1 pill single dose for yeast infection. If the odor continues to be abnormal after 3 or 4 days you can take the second pill.  Use the fluticasone nose spray 2 sprays in each nostril in the evening for the congestion and postnasal drainage  Take the pantoprazole 1 pill each evening for stomach acid reflux control. I hope that this will reduce the gnawing discomfort in your abdomen.  Try to drink more fluids as you are a little too dry.  If you have spells where you feel like you're breaking out in sweat you should eat something sweet or drink orange juice, and try and check your blood sugar to see if it is extremely low. Call for help if necessary.  I'm concerned about your steady weight loss. I would like you to try the above and return in one week. We may need to refer you for some scans if not improving.  Make sure you follow-up with Dr. Lucianne Muss for the poorly controlled blood sugars.     HOPPER,DAVID, MD 07/11/2014

## 2014-07-11 NOTE — Patient Instructions (Signed)
Take the fluconazole 1 pill single dose for yeast infection. If the odor continues to be abnormal after 3 or 4 days you can take the second pill.  Use the fluticasone nose spray 2 sprays in each nostril in the evening for the congestion and postnasal drainage  Take the pantoprazole 1 pill each evening for stomach acid reflux control. I hope that this will reduce the gnawing discomfort in your abdomen.  Try to drink more fluids as you are a little too dry.  If you have spells where you feel like you're breaking out in sweat you should eat something sweet or drink orange juice, and try and check your blood sugar to see if it is extremely low. Call for help if necessary.  I'm concerned about your steady weight loss. I would like you to try the above and return in one week. We may need to refer you for some scans if not improving.  Make sure you follow-up with Dr. Lucianne Muss for the poorly controlled blood sugars.

## 2014-07-12 LAB — COMPREHENSIVE METABOLIC PANEL
ALBUMIN: 3.9 g/dL (ref 3.5–5.2)
ALT: 11 U/L (ref 0–35)
AST: 17 U/L (ref 0–37)
Alkaline Phosphatase: 79 U/L (ref 39–117)
BUN: 27 mg/dL — AB (ref 6–23)
CALCIUM: 9.5 mg/dL (ref 8.4–10.5)
CHLORIDE: 95 meq/L — AB (ref 96–112)
CO2: 18 meq/L — AB (ref 19–32)
Creat: 1.01 mg/dL (ref 0.50–1.10)
Glucose, Bld: 261 mg/dL — ABNORMAL HIGH (ref 70–99)
Potassium: 5.3 mEq/L (ref 3.5–5.3)
Sodium: 130 mEq/L — ABNORMAL LOW (ref 135–145)
TOTAL PROTEIN: 7.5 g/dL (ref 6.0–8.3)
Total Bilirubin: 0.6 mg/dL (ref 0.2–1.2)

## 2014-07-13 ENCOUNTER — Telehealth: Payer: Self-pay | Admitting: *Deleted

## 2014-07-13 NOTE — Telephone Encounter (Signed)
Noted, message left on patients voicemail

## 2014-07-13 NOTE — Telephone Encounter (Signed)
Patient called back, please look at telephone encounter dated 07/04/14, she said she is only able to eat once a day usually in the mornings, she wants to know if she should still take her insulin if she's not eating?  She has gotten a PCP and said she has GERD, she states she still has no taste or smell due to post nasal drip.  Please advise.

## 2014-07-13 NOTE — Telephone Encounter (Signed)
What is her blood sugar at various times of the day?.  Needs to check some readings in the mornings and evening

## 2014-07-17 ENCOUNTER — Encounter: Payer: Self-pay | Admitting: Family Medicine

## 2014-07-18 ENCOUNTER — Emergency Department (HOSPITAL_COMMUNITY): Payer: Medicare Other

## 2014-07-18 ENCOUNTER — Encounter (HOSPITAL_COMMUNITY): Payer: Self-pay | Admitting: *Deleted

## 2014-07-18 ENCOUNTER — Other Ambulatory Visit: Payer: Self-pay | Admitting: Endocrinology

## 2014-07-18 ENCOUNTER — Ambulatory Visit (INDEPENDENT_AMBULATORY_CARE_PROVIDER_SITE_OTHER): Payer: Medicare Other | Admitting: Family Medicine

## 2014-07-18 ENCOUNTER — Inpatient Hospital Stay (HOSPITAL_COMMUNITY)
Admission: EM | Admit: 2014-07-18 | Discharge: 2014-07-24 | DRG: 196 | Disposition: A | Discharge status: Home / Self Care | Payer: Medicare Other | Attending: Internal Medicine | Admitting: Internal Medicine

## 2014-07-18 ENCOUNTER — Encounter: Payer: Self-pay | Admitting: Family Medicine

## 2014-07-18 VITALS — BP 76/45 | HR 81 | Temp 97.4°F | Resp 20 | Ht 66.25 in | Wt 198.0 lb

## 2014-07-18 DIAGNOSIS — K449 Diaphragmatic hernia without obstruction or gangrene: Secondary | ICD-10-CM | POA: Diagnosis present

## 2014-07-18 DIAGNOSIS — D649 Anemia, unspecified: Secondary | ICD-10-CM

## 2014-07-18 DIAGNOSIS — R1310 Dysphagia, unspecified: Secondary | ICD-10-CM | POA: Diagnosis present

## 2014-07-18 DIAGNOSIS — Z7952 Long term (current) use of systemic steroids: Secondary | ICD-10-CM

## 2014-07-18 DIAGNOSIS — E872 Acidosis, unspecified: Secondary | ICD-10-CM | POA: Diagnosis present

## 2014-07-18 DIAGNOSIS — Z7951 Long term (current) use of inhaled steroids: Secondary | ICD-10-CM

## 2014-07-18 DIAGNOSIS — I959 Hypotension, unspecified: Secondary | ICD-10-CM | POA: Diagnosis not present

## 2014-07-18 DIAGNOSIS — J82 Pulmonary eosinophilia, not elsewhere classified: Principal | ICD-10-CM | POA: Diagnosis present

## 2014-07-18 DIAGNOSIS — Z7982 Long term (current) use of aspirin: Secondary | ICD-10-CM | POA: Diagnosis not present

## 2014-07-18 DIAGNOSIS — K219 Gastro-esophageal reflux disease without esophagitis: Secondary | ICD-10-CM | POA: Diagnosis present

## 2014-07-18 DIAGNOSIS — J189 Pneumonia, unspecified organism: Secondary | ICD-10-CM | POA: Diagnosis present

## 2014-07-18 DIAGNOSIS — R531 Weakness: Secondary | ICD-10-CM

## 2014-07-18 DIAGNOSIS — E871 Hypo-osmolality and hyponatremia: Secondary | ICD-10-CM | POA: Diagnosis present

## 2014-07-18 DIAGNOSIS — I10 Essential (primary) hypertension: Secondary | ICD-10-CM | POA: Diagnosis present

## 2014-07-18 DIAGNOSIS — Z6833 Body mass index (BMI) 33.0-33.9, adult: Secondary | ICD-10-CM | POA: Diagnosis not present

## 2014-07-18 DIAGNOSIS — D72829 Elevated white blood cell count, unspecified: Secondary | ICD-10-CM

## 2014-07-18 DIAGNOSIS — I2699 Other pulmonary embolism without acute cor pulmonale: Secondary | ICD-10-CM | POA: Diagnosis present

## 2014-07-18 DIAGNOSIS — Z833 Family history of diabetes mellitus: Secondary | ICD-10-CM

## 2014-07-18 DIAGNOSIS — M1990 Unspecified osteoarthritis, unspecified site: Secondary | ICD-10-CM | POA: Diagnosis present

## 2014-07-18 DIAGNOSIS — J302 Other seasonal allergic rhinitis: Secondary | ICD-10-CM | POA: Diagnosis present

## 2014-07-18 DIAGNOSIS — E860 Dehydration: Secondary | ICD-10-CM | POA: Diagnosis present

## 2014-07-18 DIAGNOSIS — E1165 Type 2 diabetes mellitus with hyperglycemia: Secondary | ICD-10-CM | POA: Diagnosis present

## 2014-07-18 DIAGNOSIS — R918 Other nonspecific abnormal finding of lung field: Secondary | ICD-10-CM | POA: Diagnosis not present

## 2014-07-18 DIAGNOSIS — Z8249 Family history of ischemic heart disease and other diseases of the circulatory system: Secondary | ICD-10-CM

## 2014-07-18 DIAGNOSIS — E119 Type 2 diabetes mellitus without complications: Secondary | ICD-10-CM

## 2014-07-18 DIAGNOSIS — A419 Sepsis, unspecified organism: Secondary | ICD-10-CM | POA: Diagnosis not present

## 2014-07-18 DIAGNOSIS — E785 Hyperlipidemia, unspecified: Secondary | ICD-10-CM | POA: Diagnosis present

## 2014-07-18 DIAGNOSIS — K224 Dyskinesia of esophagus: Secondary | ICD-10-CM | POA: Diagnosis present

## 2014-07-18 DIAGNOSIS — R938 Abnormal findings on diagnostic imaging of other specified body structures: Secondary | ICD-10-CM | POA: Diagnosis not present

## 2014-07-18 DIAGNOSIS — Z8679 Personal history of other diseases of the circulatory system: Secondary | ICD-10-CM | POA: Diagnosis not present

## 2014-07-18 DIAGNOSIS — E669 Obesity, unspecified: Secondary | ICD-10-CM | POA: Diagnosis present

## 2014-07-18 DIAGNOSIS — T464X5A Adverse effect of angiotensin-converting-enzyme inhibitors, initial encounter: Secondary | ICD-10-CM | POA: Diagnosis present

## 2014-07-18 DIAGNOSIS — I5032 Chronic diastolic (congestive) heart failure: Secondary | ICD-10-CM | POA: Diagnosis present

## 2014-07-18 DIAGNOSIS — Z794 Long term (current) use of insulin: Secondary | ICD-10-CM | POA: Diagnosis not present

## 2014-07-18 DIAGNOSIS — IMO0002 Reserved for concepts with insufficient information to code with codable children: Secondary | ICD-10-CM | POA: Diagnosis present

## 2014-07-18 DIAGNOSIS — Z885 Allergy status to narcotic agent status: Secondary | ICD-10-CM

## 2014-07-18 DIAGNOSIS — E1110 Type 2 diabetes mellitus with ketoacidosis without coma: Secondary | ICD-10-CM | POA: Diagnosis present

## 2014-07-18 DIAGNOSIS — R0902 Hypoxemia: Secondary | ICD-10-CM | POA: Diagnosis present

## 2014-07-18 DIAGNOSIS — R9389 Abnormal findings on diagnostic imaging of other specified body structures: Secondary | ICD-10-CM

## 2014-07-18 DIAGNOSIS — R710 Precipitous drop in hematocrit: Secondary | ICD-10-CM

## 2014-07-18 DIAGNOSIS — Z87891 Personal history of nicotine dependence: Secondary | ICD-10-CM

## 2014-07-18 LAB — URINALYSIS, ROUTINE W REFLEX MICROSCOPIC
GLUCOSE, UA: NEGATIVE mg/dL
Ketones, ur: 15 mg/dL — AB
LEUKOCYTES UA: NEGATIVE
Nitrite: NEGATIVE
PH: 5 (ref 5.0–8.0)
Protein, ur: 30 mg/dL — AB
SPECIFIC GRAVITY, URINE: 1.014 (ref 1.005–1.030)
UROBILINOGEN UA: 0.2 mg/dL (ref 0.0–1.0)

## 2014-07-18 LAB — TSH: TSH: 3.254 u[IU]/mL (ref 0.350–4.500)

## 2014-07-18 LAB — CBC WITH DIFFERENTIAL/PLATELET
BASOS PCT: 0 % (ref 0–1)
Basophils Absolute: 0 10*3/uL (ref 0.0–0.1)
EOS PCT: 52 % — AB (ref 0–5)
Eosinophils Absolute: 7.2 10*3/uL — ABNORMAL HIGH (ref 0.0–0.7)
HEMATOCRIT: 35.3 % — AB (ref 36.0–46.0)
HEMOGLOBIN: 11.9 g/dL — AB (ref 12.0–15.0)
LYMPHS PCT: 9 % — AB (ref 12–46)
Lymphs Abs: 1.3 10*3/uL (ref 0.7–4.0)
MCH: 28.7 pg (ref 26.0–34.0)
MCHC: 33.7 g/dL (ref 30.0–36.0)
MCV: 85.1 fL (ref 78.0–100.0)
Monocytes Absolute: 0.3 10*3/uL (ref 0.1–1.0)
Monocytes Relative: 2 % — ABNORMAL LOW (ref 3–12)
NEUTROS ABS: 5.1 10*3/uL (ref 1.7–7.7)
Neutrophils Relative %: 37 % — ABNORMAL LOW (ref 43–77)
Platelets: 436 10*3/uL — ABNORMAL HIGH (ref 150–400)
RBC: 4.15 MIL/uL (ref 3.87–5.11)
RDW: 13.7 % (ref 11.5–15.5)
WBC: 13.9 10*3/uL — AB (ref 4.0–10.5)

## 2014-07-18 LAB — URINE MICROSCOPIC-ADD ON

## 2014-07-18 LAB — I-STAT CHEM 8, ED
BUN: 36 mg/dL — ABNORMAL HIGH (ref 6–20)
CREATININE: 1.4 mg/dL — AB (ref 0.44–1.00)
Calcium, Ion: 1.21 mmol/L (ref 1.13–1.30)
Chloride: 102 mmol/L (ref 101–111)
Glucose, Bld: 159 mg/dL — ABNORMAL HIGH (ref 65–99)
HEMATOCRIT: 40 % (ref 36.0–46.0)
HEMOGLOBIN: 13.6 g/dL (ref 12.0–15.0)
POTASSIUM: 5 mmol/L (ref 3.5–5.1)
SODIUM: 130 mmol/L — AB (ref 135–145)
TCO2: 17 mmol/L (ref 0–100)

## 2014-07-18 LAB — POCT CBC
Granulocyte percent: 75.2 %G (ref 37–80)
HEMATOCRIT: 38.9 % (ref 37.7–47.9)
Hemoglobin: 12.4 g/dL (ref 12.2–16.2)
LYMPH, POC: 1.6 (ref 0.6–3.4)
MCH, POC: 27.2 pg (ref 27–31.2)
MCHC: 31.8 g/dL (ref 31.8–35.4)
MCV: 85.6 fL (ref 80–97)
MID (CBC): 2.3 — AB (ref 0–0.9)
MPV: 7.6 fL (ref 0–99.8)
POC GRANULOCYTE: 11.7 — AB (ref 2–6.9)
POC LYMPH %: 10.3 % (ref 10–50)
POC MID %: 14.5 %M — AB (ref 0–12)
Platelet Count, POC: 471 10*3/uL — AB (ref 142–424)
RBC: 4.54 M/uL (ref 4.04–5.48)
RDW, POC: 14.2 %
WBC: 15.6 10*3/uL — AB (ref 4.6–10.2)

## 2014-07-18 LAB — GLUCOSE, CAPILLARY: Glucose-Capillary: 151 mg/dL — ABNORMAL HIGH (ref 65–99)

## 2014-07-18 LAB — GLUCOSE, POCT (MANUAL RESULT ENTRY): POC GLUCOSE: 146 mg/dL — AB (ref 70–99)

## 2014-07-18 LAB — I-STAT TROPONIN, ED: Troponin i, poc: 0.01 ng/mL (ref 0.00–0.08)

## 2014-07-18 LAB — MRSA PCR SCREENING: MRSA BY PCR: NEGATIVE

## 2014-07-18 MED ORDER — ENOXAPARIN (LOVENOX) PATIENT EDUCATION KIT
PACK | Freq: Once | Status: AC
  Administered 2014-07-18: 22:00:00
  Filled 2014-07-18: qty 1

## 2014-07-18 MED ORDER — SODIUM CHLORIDE 0.9 % IV BOLUS (SEPSIS)
1000.0000 mL | Freq: Once | INTRAVENOUS | Status: AC
  Administered 2014-07-18: 1000 mL via INTRAVENOUS

## 2014-07-18 MED ORDER — ENOXAPARIN (LOVENOX) PATIENT EDUCATION KIT: PACK | Freq: Once | Status: DC

## 2014-07-18 MED ORDER — FLUTICASONE PROPIONATE 50 MCG/ACT NA SUSP
1.0000 | Freq: Every day | NASAL | Status: DC
  Administered 2014-07-18 – 2014-07-23 (×6): 1 via NASAL
  Filled 2014-07-18: qty 16

## 2014-07-18 MED ORDER — ENOXAPARIN SODIUM 100 MG/ML ~~LOC~~ SOLN
1.0000 mg/kg | Freq: Two times a day (BID) | SUBCUTANEOUS | Status: DC
  Administered 2014-07-18 – 2014-07-22 (×9): 90 mg via SUBCUTANEOUS
  Filled 2014-07-18 (×11): qty 1

## 2014-07-18 MED ORDER — SODIUM CHLORIDE 0.9 % IV BOLUS (SEPSIS)
500.0000 mL | Freq: Once | INTRAVENOUS | Status: AC
  Administered 2014-07-18: 500 mL via INTRAVENOUS

## 2014-07-18 MED ORDER — IOHEXOL 300 MG/ML  SOLN
75.0000 mL | Freq: Once | INTRAMUSCULAR | Status: AC | PRN
  Administered 2014-07-18: 75 mL via INTRAVENOUS

## 2014-07-18 MED ORDER — ASPIRIN EC 81 MG PO TBEC
81.0000 mg | DELAYED_RELEASE_TABLET | Freq: Every day | ORAL | Status: DC
  Administered 2014-07-18 – 2014-07-24 (×7): 81 mg via ORAL
  Filled 2014-07-18 (×8): qty 1

## 2014-07-18 MED ORDER — DEXTROSE 5 % IV SOLN
1.0000 g | Freq: Once | INTRAVENOUS | Status: AC
  Administered 2014-07-18: 1 g via INTRAVENOUS
  Filled 2014-07-18: qty 10

## 2014-07-18 MED ORDER — DEXTROSE 5 % IV SOLN
500.0000 mg | Freq: Once | INTRAVENOUS | Status: AC
  Administered 2014-07-18: 500 mg via INTRAVENOUS
  Filled 2014-07-18: qty 500

## 2014-07-18 MED ORDER — FLUCONAZOLE 200 MG PO TABS
200.0000 mg | ORAL_TABLET | Freq: Once | ORAL | Status: AC
  Administered 2014-07-18: 200 mg via ORAL
  Filled 2014-07-18: qty 1

## 2014-07-18 MED ORDER — INSULIN ASPART 100 UNIT/ML ~~LOC~~ SOLN
0.0000 [IU] | Freq: Three times a day (TID) | SUBCUTANEOUS | Status: DC
  Administered 2014-07-19 (×2): 2 [IU] via SUBCUTANEOUS
  Administered 2014-07-20 (×2): 5 [IU] via SUBCUTANEOUS
  Administered 2014-07-20: 8 [IU] via SUBCUTANEOUS
  Administered 2014-07-21: 15 [IU] via SUBCUTANEOUS
  Administered 2014-07-21 (×2): 11 [IU] via SUBCUTANEOUS

## 2014-07-18 MED ORDER — ASPIRIN 81 MG PO TABS: 81.0000 mg | ORAL_TABLET | Freq: Every day | ORAL | Status: DC

## 2014-07-18 MED ORDER — ENSURE ENLIVE PO LIQD
237.0000 mL | Freq: Two times a day (BID) | ORAL | Status: DC
  Administered 2014-07-19 – 2014-07-24 (×11): 237 mL via ORAL

## 2014-07-18 MED ORDER — PANTOPRAZOLE SODIUM 40 MG PO TBEC
40.0000 mg | DELAYED_RELEASE_TABLET | Freq: Every day | ORAL | Status: DC
  Administered 2014-07-18 – 2014-07-21 (×4): 40 mg via ORAL
  Filled 2014-07-18 (×4): qty 1

## 2014-07-18 NOTE — ED Notes (Signed)
Dr. Alwyn RenHopper from [facility] called and advised to make note on patient.   Patient is "concerning for dehydration with a BP of 76, elevated WBC, progressive drop of HgB in last 2 weeks, uncontrolled diabetes with CBG in 150s, recent 35 lb weight loss."

## 2014-07-18 NOTE — Progress Notes (Signed)
UR COMPLETED  

## 2014-07-18 NOTE — ED Notes (Signed)
Phlebotomy at bedside.

## 2014-07-18 NOTE — ED Notes (Addendum)
Phlebotomy at bedside. This RN will start antibiotics once blood cultures are drawn.

## 2014-07-18 NOTE — ED Notes (Signed)
Bedside commode brought to room per RN Dreama. Pt denies needing assistance with use of commode. Pt asked to inform staff if assistance is needed.

## 2014-07-18 NOTE — Progress Notes (Signed)
Subjective: Patient drove herself in here today. Since saw her last week she has continued to feel just very weak and tired. She has not been able to eat or drink much. She feels like things just don't want to go down. She has not checked sugars. She has not been drinking orange juice, does drink some water. She has only urinated one time this morning she has not had headache or specific dizziness. Just feels tired. She does not have any chest pain. Feels maybe a little bit short of breath. No abdominal pain but feels like things are coming up from her stomach into her throat but she cannot throw up. Her bowels have not been acting. She is not urinating very often. No musculoskeletal complaints.  She does take medicines for her blood pressure and diabetes as well listed in the chart.  Objective: Pleasant lady, very quiet and pale and weak appearing today. Her throat is clear. Tongue dry. Neck supple without nodes. Chest clear. Heart regular without murmur. Abdomen soft without masses or tenderness. Throat is without edema. Decreased skin turgor.  EKG was ordered but not done because EMS arrived.  Glucose 146  Hgb 12.4, WBC 15,700  Assessment: Dehydration and hypotension Anorexia Nausea Weight loss History Poorly controlled type 2 diabetes Falling hemoglobin, suspicious for occult GI blood loss with hemoglobin going down despite dehydration Leukocytosis, rule out sepsis  Plan: IV begun EMS is here and [facility].  EMS done EKG looks okay.  This is a high acuity issue. I tried to speak to the daughter about the patient unsuccessfully (no answer). She is dehydrated. Sepsis would be a possibility with the high white count.  Although the hemoglobin is in the normal range, I am concerned that it is falling despite dehydration. She needs a rectal check also to make sure she is not bleeding internally.  [facility] and explained the situation.

## 2014-07-18 NOTE — ED Notes (Signed)
Pt placed in gown and in bed. Pt monitored by pulse ox, bp cuff, and 12-lead. 

## 2014-07-18 NOTE — ED Notes (Signed)
Attempted report to 3S. 

## 2014-07-18 NOTE — ED Notes (Signed)
Called phlebotomy to collect blood cultures.

## 2014-07-18 NOTE — ED Notes (Signed)
Pt arrives from [facility] via [facility]. Pt was sent rt hypotension with systolic bp at 76. Pt has been normo tensive en route to ED. Pt states she has had weakness for "a couple of weeks, but it's getting worse". Pt also has c/o loss of appetite and trouble swallowing.

## 2014-07-18 NOTE — Progress Notes (Signed)
ANTICOAGULATION CONSULT NOTE - Initial Consult  Pharmacy Consult for Lovenox Indication: pulmonary embolus  Allergies  Allergen Reactions  . Codeine Anxiety    Patient Measurements: Height: 5\' 6"  (167.6 cm) Weight: 198 lb 2 oz (89.869 kg) IBW/kg (Calculated) : 59.3 Heparin Dosing Weight:   Vital Signs: Temp: 97.8 F (36.6 C) (06/28 1335) Temp Source: Oral (06/28 1335) BP: 98/57 mmHg (06/28 1745) Pulse Rate: 77 (06/28 1745)  Labs:  Recent Labs  07/18/14 1251 07/18/14 1450 07/18/14 1502  HGB 12.4 11.9* 13.6  HCT 38.9 35.3* 40.0  PLT  --  436*  --   CREATININE  --   --  1.40*    Estimated Creatinine Clearance: 43.4 mL/min (by C-G formula based on Cr of 1.4).   Medical History: Past Medical History  Diagnosis Date  . Diabetes mellitus   . Hypertension   . Bilateral swelling of feet   . Arthritis   . Incontinence     Medications:   (Not in a hospital admission) Scheduled:   Infusions:  . sodium chloride      [name], Plt 436, [name]   Goal of Therapy:  Monitor platelets by anticoagulation protocol: Yes   Plan:  Lovenox 1mg /kg subcutaneously BID Continue to monitor H&H and platelets Educate patient on Lovenox  Arlean HoppingCorey M. Newman PiesBall, PharmD Clinical Pharmacist Pager [phone] 07/18/2014,5:57 PM

## 2014-07-18 NOTE — ED Provider Notes (Signed)
CSN: [id number]     Arrival date & time 07/18/14  1332 History   First MD Initiated Contact with Patient 07/18/14 1338     Chief Complaint  Patient presents with  . Fatigue     (Consider location/radiation/quality/duration/timing/severity/associated sxs/prior Treatment) The history is provided by the patient and medical records.    This is a 69 year old female with past medical history significant for hypertension, diabetes, arthritis, urinary incontinence, presenting to the ED for hypertension. Patient states she's been feeling generally weak for approximately 6 weeks. She states is getting progressively worse. States she has had a decreased appetite-- states she will fix a normal sized plate of food for herself but is unable to finish it like she used to.  States she does have episodes where she has trouble swallowing in which she feels that food is coming back up into her throat but she does not vomit.  This mostly occurs with solid foods, has to chew for a long time before she can even begin to swallow.  No difficulty swallowing liquids.  She has continued drinking large amounts of water because she feels dehydrated.  States she has been seeing PCP about this, was diagnosed with component of GERD.  States Sunday afternoon after church she was trying to walk to her car and had to sit down 3x before getting to her car which is very abnormal for her.  States she feels very fatigued with low energy.  No dizziness or syncope.  No chest pain or SOB.  No cough, fever, abdominal pain.  Denies dysuria, hematuria, urinary frequency.  Bowel movements have been somewhat irregular which she attributes to eating less than normal.  Denies melena or hematochezia.  States she went for follow-up appt today at PCP office and was sent here due to hypotension.  BP has improved by time of arrival in ED.  Past Medical History  Diagnosis Date  . Diabetes mellitus   . Hypertension   . Bilateral swelling of feet   .  Arthritis   . Incontinence    Past Surgical History  Procedure Laterality Date  . Back surgery  2003  . Abdominal hysterectomy  1982    partial   Family History  Problem Relation Age of Onset  . Diabetes Mother   . Heart disease Mother   . Mental illness Father     dementia  . Diabetes Sister   . Diabetes Sister   . Heart disease Sister    History  Substance Use Topics  . Smoking status: Former Smoker    Quit date: 08/16/1979  . Smokeless tobacco: Never Used     Comment: quit 31 yrs ago  . Alcohol Use: No   OB History    No data available     Review of Systems  Constitutional: Positive for fatigue.  Neurological: Positive for weakness.  All other systems reviewed and are negative.     Allergies  Codeine  Home Medications   Prior to Admission medications   Medication Sig Start Date End Date Taking? Authorizing Provider  aspirin 81 MG tablet Take 81 mg by mouth daily.    Historical Provider, MD  bisoprolol-hydrochlorothiazide [facility](ZIAC) 10-6.25 MG per tablet Take 1 tablet by mouth daily. 05/30/14   Reather LittlerAjay Kumar, MD  colesevelam [facility]WELCHOL) 625 MG tablet Take 3 tablets twice a day 08/29/13   Reather LittlerAjay Kumar, MD  fluconazole (DIFLUCAN) 150 MG tablet Take 1 pill single dose for yeast infection 07/11/14   Peyton Najjaravid H Hopper, MD  fluticasone (FLONASE) 50 MCG/ACT nasal spray Use 2 sprays in each nostril once daily before bedtime for the congestion and postnasal drainage 07/11/14   Peyton Najjar, MD  glucose blood (FREESTYLE LITE) test strip Use as instructed to check blood sugar 2 times per day dx code E11.40 04/24/14   Reather Littler, MD  insulin aspart (NOVOLOG FLEXPEN) 100 UNIT/ML FlexPen Inject 16 units in the morning and 28 units in the evening. 03/30/14   Reather Littler, MD  insulin glargine (LANTUS) 100 UNIT/ML injection INJECT 0.5 MLS (50 UNITS TOTAL) INTO THE SKIN AT BEDTIME. 08/01/13   Reather Littler, MD  Insulin Pen Needle (BD PEN NEEDLE NANO U/F) 32G X 4 MM MISC Use to inject Novolog 2 times a  day 03/30/14   Reather Littler, MD  Insulin Syringe-Needle U-100 (B-D INS SYR ULTRAFINE 1CC/31G) 31G X 5/16" 1 ML MISC USE AS DIRECTED TWICE A DAY 02/15/14   Reather Littler, MD  Insulin Syringe-Needle U-100 (B-D INS SYRINGE 0.5CC/31GX5/16) 31G X 5/16" 0.5 ML MISC USE TO INJECT INSULIN 3 TIMES A DAY Dx code E11.9 04/25/14   Reather Littler, MD  metFORMIN (GLUCOPHAGE-XR) 500 MG 24 hr tablet TAKE 2 TABLETS TWICE A DAY 07/18/14   Reather Littler, MD  pantoprazole (PROTONIX) 40 MG tablet Take one pill daily for acid reflux 07/11/14   Peyton Najjar, MD  predniSONE (DELTASONE) 10 MG tablet 2.5 mg Daily.  08/08/10   Historical Provider, MD  ramipril (ALTACE) 10 MG capsule TAKE ONE CAPSULE DAILY 05/09/14   Reather Littler, MD   BP 99/51 mmHg  Pulse 74  Temp(Src) 97.8 F (36.6 C) (Oral)  Resp 18  Ht  (1.676 m)  Wt 198 lb 2 oz (89.869 kg)  BMI 31.99 kg/m2  SpO2 98%   Physical Exam  Constitutional: She is oriented to person, place, and time. She appears well-developed and well-nourished. No distress.  Very pleasant, NAD  HENT:  Head: Normocephalic and atraumatic.  Mouth/Throat: Oropharynx is clear and moist.  Eyes: Conjunctivae and EOM are normal. Pupils are equal, round, and reactive to light.  Neck: Normal range of motion. Neck supple.  Cardiovascular: Normal rate, regular rhythm and normal heart sounds.   Pulmonary/Chest: Effort normal and breath sounds normal. No respiratory distress. She has no wheezes.  Abdominal: Soft. Bowel sounds are normal. There is no tenderness. There is no guarding.  Musculoskeletal: Normal range of motion. She exhibits no edema.  Neurological: She is alert and oriented to person, place, and time.  AAOx3, answering questions and following commands appropriately; equal strength UE and LE bilaterally; CN grossly intact; moves all extremities appropriately without ataxia; normal coordination, no focal neuro deficits or facial asymmetry appreciated  Skin: Skin is warm and dry. She is not  diaphoretic.  Psychiatric: She has a normal mood and affect.  Nursing note and vitals reviewed.   ED Course  Procedures (including critical care time) Labs Review Labs Reviewed  CBC WITH DIFFERENTIAL/PLATELET - Abnormal; Notable for the following:    WBC 13.9 (*)    Hemoglobin 11.9 (*)    HCT 35.3 (*)    Platelets 436 (*)    Neutrophils Relative % 37 (*)    Lymphocytes Relative 9 (*)    Monocytes Relative 2 (*)    Eosinophils Relative 52 (*)    Eosinophils Absolute 7.2 (*)    All other components within normal limits  URINALYSIS, ROUTINE W REFLEX MICROSCOPIC (NOT AT [facility]) - Abnormal; Notable for the following:  APPearance CLOUDY (*)    Hgb urine dipstick SMALL (*)    Bilirubin Urine MODERATE (*)    Ketones, ur 15 (*)    Protein, ur 30 (*)    All other components within normal limits  URINE MICROSCOPIC-ADD ON - Abnormal; Notable for the following:    Squamous Epithelial / LPF MANY (*)    Bacteria, UA FEW (*)    Casts HYALINE CASTS (*)    All other components within normal limits  I-STAT CHEM 8, ED - Abnormal; Notable for the following:    Sodium 130 (*)    BUN 36 (*)    Creatinine, Ser 1.40 (*)    Glucose, Bld 159 (*)    All other components within normal limits  I-STAT TROPOININ, ED    Imaging Review Dg Chest 2 View  07/18/2014   CLINICAL DATA:  Dehydration elevated white blood cell count with increased weakness  EXAM: CHEST - 2 VIEW  COMPARISON:  11/29/2013  FINDINGS: Cardiac shadow is enlarged. Multiple rounded areas of increased density are noted throughout both lungs. Some have suggestion of cavitation. These may represent multiple septic emboli although the possibility of underlying mass lesion would deserve consideration as well. CT of the chest with contrast is recommended for further evaluation.  IMPRESSION: Changes suggestive of cavitary lesions within the lungs bilaterally. These may represent infectious etiology although the possibility of cavitary neoplasm  deserve consideration as well. CT of the chest is recommended.   Electronically Signed   By: Alcide Clever M.D.   On: 07/18/2014 14:12     EKG Interpretation   Date/Time:  Tuesday July 18 2014 13:34:00 EDT Ventricular Rate:  76 PR Interval:  155 QRS Duration: 106 QT Interval:  456 QTC Calculation: 513 R Axis:   -46 Text Interpretation:  Sinus rhythm LAD, consider left anterior fascicular  block LVH with secondary repolarization abnormality Anterior Q waves,  possibly due to LVH Prolonged QT interval No significant change since last  tracing Confirmed by Ethelda Chick  MD, SAM [phone]) on 07/18/2014 1:40:37 PM      MDM   Final diagnoses:  Generalized weakness  Abnormal CXR   68 year old female here from PCP office for generalized weakness and hypotension. This is been progressive over the past 6 weeks. She reports decreased appetite and some difficulty swallowing solids. BP has improved with fluids here in the ED.  Patient remains afebrile, nontoxic in appearance. She is awake, alert, and appropriately oriented. She has no focal neurologic deficits on exam. Will obtain basic labs, urinalysis, chest x-ray.  Patient given additional IV fluids.  Labwork remarkable for elevated serum creatinine and BUN consistent with stated dehydration. UA appears contaminated.  There is a slight leukocytosis, however patient is on chronic steroids. Chest x-ray with noted cavitary lesions, questionable infection versus neoplasm. Recommended CT of chest. Given that patient has had difficulty swallowing, will also obtain CT of neck.  BP much improved at this time, now 102/65.  4:34 PM CT neck and chest pending at this time.  Care signed out to PA Lawyer at shift change. Will follow results of scans, monitor BP.  Garlon Hatchet, PA-C 07/18/14 1634  Doug Sou, MD 07/18/14 [phone]

## 2014-07-18 NOTE — H&P (Addendum)
History and Physical  Beverly Carlson ZOX:[id number] DOB: 1945-08-04 DOA: 07/18/2014  Referring physician: EDP PCP: Janace Hoard, MD   Chief Complaint: fatigue  HPI: Beverly Carlson is a 69 y.o. female  This is a 69 year old female with past medical history significant for hypertension, diabetes, arthritis, urinary incontinence, presenting to the ED for hypertension. Patient states she's been feeling generally weak for approximately 6 weeks. She states is getting progressively worse. States she has had a decreased appetite-- states she will fix a normal sized plate of food for herself but is unable to finish it like she used to. States she does have episodes where she has trouble swallowing in which she feels that food is coming back up into her throat but she does not vomit. This mostly occurs with solid foods, has to chew for a long time before she can even begin to swallow. No difficulty swallowing liquids. She has continued drinking large amounts of water because she feels dehydrated. States she has been seeing PCP about this, was diagnosed with component of GERD. States Sunday afternoon after church she was trying to walk to her car and had to sit down 3x before getting to her car which is very abnormal for her. States she feels very fatigued with low energy. No dizziness or syncope. No chest pain or SOB. No cough, fever, abdominal pain. Denies dysuria, hematuria, urinary frequency. Bowel movements have been somewhat irregular which she attributes to eating less than normal. Denies melena or hematochezia. States she went for follow-up appt today at PCP office and was sent here due to hypotension. BP has improved by time of arrival in ED.  ED course: cxr with questionable cavitary lung lesions which prompted a CT chest which showed bilateral patchy ground glass opacities and incidental PE, patient given ivf/rocephin/zitrho in the ED, hospitalist called for admission.  On arrival to  patient's room, patient denies cough, no chest pain, no fever, no sob, on room air, not in distress, does report progressive weakness and weight loss in the last 6-8 weeks.   Review of Systems:  Detail per HPI, Review of systems are otherwise negative  Past Medical History  Diagnosis Date  . Diabetes mellitus   . Hypertension   . Bilateral swelling of feet   . Arthritis   . Incontinence    Past Surgical History  Procedure Laterality Date  . Back surgery  2003  . Abdominal hysterectomy  1982    partial   Social History:  reports that she quit smoking about 34 years ago. She has never used smokeless tobacco. She reports that she does not drink alcohol or use illicit drugs. Patient lives at *& is able to participate in activities of daily living independently *  Allergies  Allergen Reactions  . Codeine Anxiety    Family History  Problem Relation Age of Onset  . Diabetes Mother   . Heart disease Mother   . Mental illness Father     dementia  . Diabetes Sister   . Diabetes Sister   . Heart disease Sister       Prior to Admission medications   Medication Sig Start Date End Date Taking? Authorizing Provider  aspirin 81 MG tablet Take 81 mg by mouth daily.   Yes Historical Provider, MD  bisoprolol-hydrochlorothiazide (ZIAC) 10-6.25 MG per tablet Take 1 tablet by mouth daily. 05/30/14  Yes Reather Littler, MD  colesevelam [facility]) 625 MG tablet Take 3 tablets twice a day 08/29/13  Yes Reather Littler, MD  fluticasone (FLONASE) 50 MCG/ACT nasal spray Use 2 sprays in each nostril once daily before bedtime for the congestion and postnasal drainage Patient taking differently: Place 1 spray into both nostrils at bedtime.  07/11/14  Yes Peyton Najjaravid H Hopper, MD  insulin aspart (NOVOLOG FLEXPEN) 100 UNIT/ML FlexPen Inject 16 units in the morning and 28 units in the evening. 03/30/14  Yes Reather LittlerAjay Kumar, MD  insulin glargine (LANTUS) 100 UNIT/ML injection INJECT 0.5 MLS (50 UNITS TOTAL) INTO THE SKIN AT  BEDTIME. 08/01/13  Yes Reather LittlerAjay Kumar, MD  metFORMIN (GLUCOPHAGE-XR) 500 MG 24 hr tablet TAKE 2 TABLETS TWICE A DAY 07/18/14  Yes Reather LittlerAjay Kumar, MD  pantoprazole (PROTONIX) 40 MG tablet Take one pill daily for acid reflux Patient taking differently: Take 40 mg by mouth daily.  07/11/14  Yes Peyton Najjaravid H Hopper, MD  predniSONE (DELTASONE) 2.5 MG tablet Take 2.5 mg by mouth daily. 04/22/14  Yes Historical Provider, MD  ramipril (ALTACE) 10 MG capsule TAKE ONE CAPSULE DAILY 05/09/14  Yes Reather LittlerAjay Kumar, MD  fluconazole (DIFLUCAN) 150 MG tablet Take 1 pill single dose for yeast infection Patient not taking: Reported on 07/18/2014 07/11/14   Peyton Najjaravid H Hopper, MD    Physical Exam: BP 118/64 mmHg  Pulse 81  Temp(Src) 97.8 F (36.6 C) (Oral)  Resp 24  Ht 5\' 6"  (1.676 m)  Wt 89.869 kg (198 lb 2 oz)  BMI 31.99 kg/m2  SpO2 97%  General:  Aaox3, NAD, weak Eyes: PERRL ENT: unremarkable Neck: supple, no JVD Cardiovascular: RRR, +2/6 murmur Respiratory: CTABL Abdomen: soft/ND/ND, positive bowel sounds Skin: no rash Musculoskeletal:  No edema Psychiatric: calm/cooperative Neurologic: no focal findings            Labs on Admission:  Basic Metabolic Panel:  Recent Labs Lab 07/18/14 1502  NA 130*  K 5.0  CL 102  GLUCOSE 159*  BUN 36*  CREATININE 1.40*   Liver Function Tests: No results for input(s): AST, ALT, ALKPHOS, BILITOT, PROT, ALBUMIN in the last 168 hours. No results for input(s): LIPASE, AMYLASE in the last 168 hours. No results for input(s): AMMONIA in the last 168 hours. CBC:  Recent Labs Lab 07/18/14 1251 07/18/14 1450 07/18/14 1502  WBC 15.6* 13.9*  --   NEUTROABS  --  5.1  --   HGB 12.4 11.9* 13.6  HCT 38.9 35.3* 40.0  MCV 85.6 85.1  --   PLT  --  436*  --    Cardiac Enzymes: No results for input(s): CKTOTAL, CKMB, CKMBINDEX, TROPONINI in the last 168 hours.  BNP (last 3 results) No results for input(s): BNP in the last 8760 hours.  ProBNP (last 3 results) No results for  input(s): PROBNP in the last 8760 hours.  CBG: No results for input(s): GLUCAP in the last 168 hours.  Radiological Exams on Admission: Dg Chest 2 View  07/18/2014   CLINICAL DATA:  Dehydration elevated white blood cell count with increased weakness  EXAM: CHEST - 2 VIEW  COMPARISON:  11/29/2013  FINDINGS: Cardiac shadow is enlarged. Multiple rounded areas of increased density are noted throughout both lungs. Some have suggestion of cavitation. These may represent multiple septic emboli although the possibility of underlying mass lesion would deserve consideration as well. CT of the chest with contrast is recommended for further evaluation.  IMPRESSION: Changes suggestive of cavitary lesions within the lungs bilaterally. These may represent infectious etiology although the possibility of cavitary neoplasm deserve consideration as well. CT of the chest is recommended.   Electronically  Signed   By: Alcide Clever M.D.   On: 07/18/2014 14:12   Ct Soft Tissue Neck W Contrast  07/18/2014   CLINICAL DATA:  Loss of appetite, difficulty swallowing. Weakness for 6 weeks.  EXAM: CT NECK WITH CONTRAST  TECHNIQUE: Multidetector CT imaging of the neck was performed using the standard protocol following the bolus administration of intravenous contrast.  CONTRAST:  75mL OMNIPAQUE IOHEXOL 300 MG/ML  SOLN  COMPARISON:  Cervical spine CT 11/29/2013  FINDINGS: Pharynx and larynx: Unremarkable.  Salivary glands: Unremarkable.  Thyroid: Unremarkable.  Lymph nodes: No cervical adenopathy.  Vascular: Vessels widely patent.  Limited intracranial: Unremarkable.  Visualized orbits: Unremarkable.  Mastoids and visualized paranasal sinuses: Clear.  Skeleton: Prior anterior fusion from C4-C7 with corpectomy at C5 and C6. No acute bony abnormality.  Upper chest: Extensive ground-glass opacities and areas of consolidation in both upper lobes. No visible effusions.  Other: None.  IMPRESSION: Extensive patchy airspace disease and  ground-glass opacities in both upper lobes, presumably infectious or inflammatory. Recommend correlation with chest CT.  No acute findings in the neck.   Electronically Signed   By: Charlett Nose M.D.   On: 07/18/2014 17:14   Ct Chest W Contrast  07/18/2014   CLINICAL DATA:  Abnormal chest x-ray. Patient report weakness for 6 weeks. Difficulty swallowing and loss of appetite.  EXAM: CT CHEST WITH CONTRAST  TECHNIQUE: Multidetector CT imaging of the chest was performed during intravenous contrast administration.  CONTRAST:  75mL OMNIPAQUE IOHEXOL 300 MG/ML  SOLN  COMPARISON:  Chest radiograph earlier this day.  Chest CT 11/29/2013  FINDINGS: There are no cavitary lesions corresponding to questioned abnormalities on CT. There multifocal geographic regions of ground-glass opacity as well as ground-glass nodules throughout both lungs, with a peripheral and upper lobe predominant distribution. Slightly more confluent consolidation in the right lower lobe. No definite bronchiectasis. Trachea and bronchi are patent.  Mild mediastinal adenopathy. For example, right lower paratracheal lymph node measures 1.2 cm short axis dimension. Additional mildly enlarged and prominent lymph nodes in the prevascular, lower paratracheal and subcarinal station. No definite hilar adenopathy. Decreased size pericardial effusion from prior exam. There is no pleural effusion. The thoracic aorta is normal in caliber without aneurysm or dissection. There are valvular calcifications at the aortic valve.  The esophagus is unremarkable by CT.  Not performed as a dedicated pulmonary arterial study, however there are filling defects in the segmental right lower lobe pulmonary artery. No evidence of right heart strain.  There is no axillary adenopathy. There is no acute abnormality in the included upper abdomen.  There are no acute or suspicious osseous abnormalities. Degenerative change noted in the spine.  IMPRESSION: 1. No cavitary lesions. The  questioned abnormalities on radiograph correspond to geographic ground-glass opacities and ground-glass nodules. This is in an upper lobe and peripheral predominant distribution. Differential considerations are broad and include interstitial pneumonia, eosinophilic pneumonia, or atypical infection. 2. Incidental finding of pulmonary embolus in the segmental right lower lobe pulmonary artery. A more focal consolidation at the right lung base may reflect pulmonary infarct. 3. Mild mediastinal adenopathy. These results were called by telephone at the time of interpretation on 07/18/2014 at 5:26 pm to Dr. Doug Sou, who verbally acknowledged these results.   Electronically Signed   By: Rubye Oaks M.D.   On: 07/18/2014 17:30    EKG: Independently reviewed. Sinus rhytym, QT prolongation, diffuse st/t changes. No st/t elevation  Assessment/Plan Present on Admission:  . CAP (community acquired  pneumonia) . Pulmonary emboli  Pulmonary emboli: incidental finding, denies cough, no sob, no chest pain, no tachycardia. BLE [place] pending to r/o DVT. On lovenox, consider NOAC when renal function improves. Cancer prevention screening is not uptodate, patient need to follow up with pmd to have this done once discharged, anticardiolipin/genetic testing for hypercoagulability pending, already received anticoagulation, will hold off on protein c/s level testing. Will need anticoagulation for 6-9 months at least, patient understands.  Bilateral lung infiltrates, broad differential, though no cough, no sob, no chest pain, no hypoxia. cbc does has elevated eosinophil, will get IgE level, ANCA, ACE level, blood smear (atypical lymphocyte? ).  Low peripheral lymphocytes, will get hiv. Will likely need to call pulm for possible bronch, though patient is started on anticoagulation for PE. S/p rocephin/zitrho in the ED, will hold off abx for now.   IDDM2, hold long acting insulin and metformin, start SSI here.  H/o HTN,  now hypotension: hold all home bp meds, check am cortisol level. ivf.  Dehydration/hyponatremia/ARF/FTT/fatigue: check tsh, on ivf, [place] concentrated, few yeast, will give diflucan.  EKG changes, no prior ekg, denies chest pain, no sob, will get echocardiogram to further eval,    DVT prophylaxis: on full dose of lovenox  Consultants: none  Code Status: full   Family Communication:  Patient   Disposition Plan: admit to stepdown   Time spent:  Ketan Renz MD, PhD [facility] Pager [phone] If 7PM-7AM, please contact night-coverage at [email or web address], password [facility]

## 2014-07-18 NOTE — Progress Notes (Signed)
Pt. Transferred from the ED to 3s16 after report was received about 1915 from Marga HootsNicole Stephens.  Pt. Has been oriented to her surroundings and call light is within reach.

## 2014-07-18 NOTE — ED Provider Notes (Signed)
Patient reports for several weeks she's had difficulty swallowing. After chewing food she has to spit it out. she is able to swallow liquids without problem. She was seen at urgent [facility] earlier today sent here for evaluation of dehydration. On exam patient on acutely ill-appearing. HEENT exam no facial asymmetry neck supple trachea midline lungs clear auscultation abdomen nondistended nontender  Chest x-ray viewed by me. We will also obtain CT scan of the neck, as complains of dysphagia  Doug SouSam Thersia Petraglia, MD 07/18/14 [id number]

## 2014-07-19 ENCOUNTER — Inpatient Hospital Stay (HOSPITAL_COMMUNITY): Payer: Medicare Other

## 2014-07-19 ENCOUNTER — Encounter (HOSPITAL_COMMUNITY): Payer: Self-pay

## 2014-07-19 DIAGNOSIS — I2699 Other pulmonary embolism without acute cor pulmonale: Secondary | ICD-10-CM

## 2014-07-19 DIAGNOSIS — J82 Pulmonary eosinophilia, not elsewhere classified: Principal | ICD-10-CM

## 2014-07-19 DIAGNOSIS — I10 Essential (primary) hypertension: Secondary | ICD-10-CM

## 2014-07-19 DIAGNOSIS — A419 Sepsis, unspecified organism: Secondary | ICD-10-CM

## 2014-07-19 LAB — GLUCOSE, CAPILLARY
GLUCOSE-CAPILLARY: 138 mg/dL — AB (ref 65–99)
Glucose-Capillary: 125 mg/dL — ABNORMAL HIGH (ref 65–99)
Glucose-Capillary: 99 mg/dL (ref 65–99)
Glucose-Capillary: 126 mg/dL — ABNORMAL HIGH (ref 65–99)
Glucose-Capillary: 166 mg/dL — ABNORMAL HIGH (ref 65–99)

## 2014-07-19 LAB — CBC WITH DIFFERENTIAL/PLATELET
Basophils Absolute: 0 10*3/uL (ref 0.0–0.1)
Basophils Relative: 0 % (ref 0–1)
EOS ABS: 6.4 10*3/uL — AB (ref 0.0–0.7)
Eosinophils Relative: 53 % — ABNORMAL HIGH (ref 0–5)
HCT: 35.1 % — ABNORMAL LOW (ref 36.0–46.0)
Hemoglobin: 11.7 g/dL — ABNORMAL LOW (ref 12.0–15.0)
Lymphocytes Relative: 10 % — ABNORMAL LOW (ref 12–46)
Lymphs Abs: 1.2 10*3/uL (ref 0.7–4.0)
MCH: 28.6 pg (ref 26.0–34.0)
MCHC: 33.3 g/dL (ref 30.0–36.0)
MCV: 85.8 fL (ref 78.0–100.0)
MONO ABS: 0.2 10*3/uL (ref 0.1–1.0)
Monocytes Relative: 2 % — ABNORMAL LOW (ref 3–12)
Neutro Abs: 4.2 10*3/uL (ref 1.7–7.7)
Neutrophils Relative %: 35 % — ABNORMAL LOW (ref 43–77)
PLATELETS: 395 10*3/uL (ref 150–400)
RBC: 4.09 MIL/uL (ref 3.87–5.11)
RDW: 13.8 % (ref 11.5–15.5)
WBC: 12 10*3/uL — ABNORMAL HIGH (ref 4.0–10.5)

## 2014-07-19 LAB — CORTISOL: Cortisol, Plasma: 18.1 ug/dL

## 2014-07-19 LAB — BASIC METABOLIC PANEL
Anion gap: 15 (ref 5–15)
BUN: 24 mg/dL — AB (ref 6–20)
CALCIUM: 8.5 mg/dL — AB (ref 8.9–10.3)
CO2: 16 mmol/L — ABNORMAL LOW (ref 22–32)
CREATININE: 1.18 mg/dL — AB (ref 0.44–1.00)
Chloride: 101 mmol/L (ref 101–111)
GFR, EST AFRICAN AMERICAN: 54 mL/min — AB (ref >60)
GFR, EST NON AFRICAN AMERICAN: 46 mL/min — AB (ref >60)
GLUCOSE: 118 mg/dL — AB (ref 65–99)
POTASSIUM: 5.1 mmol/L (ref 3.5–5.1)
Sodium: 132 mmol/L — ABNORMAL LOW (ref 135–145)

## 2014-07-19 LAB — SAVE SMEAR

## 2014-07-19 LAB — STREP PNEUMONIAE URINARY ANTIGEN: Strep Pneumo Urinary Antigen: NEGATIVE

## 2014-07-19 MED ORDER — WHITE PETROLATUM GEL
Status: AC
  Administered 2014-07-19: 0.2
  Filled 2014-07-19: qty 1

## 2014-07-19 MED ORDER — SODIUM CHLORIDE 0.9 % IV SOLN
INTRAVENOUS | Status: DC
  Administered 2014-07-19 (×2): via INTRAVENOUS

## 2014-07-19 MED ORDER — INSULIN GLARGINE 100 UNIT/ML ~~LOC~~ SOLN
20.0000 [IU] | Freq: Every day | SUBCUTANEOUS | Status: DC
  Administered 2014-07-19: 20 [IU] via SUBCUTANEOUS
  Filled 2014-07-19 (×3): qty 0.2

## 2014-07-19 MED ORDER — PREDNISONE 20 MG PO TABS
40.0000 mg | ORAL_TABLET | Freq: Every day | ORAL | Status: DC
  Administered 2014-07-19 – 2014-07-20 (×2): 40 mg via ORAL
  Filled 2014-07-19 (×3): qty 2

## 2014-07-19 MED ORDER — SODIUM CHLORIDE 0.9 % IV SOLN
INTRAVENOUS | Status: DC
  Administered 2014-07-19 – 2014-07-22 (×4): via INTRAVENOUS

## 2014-07-19 NOTE — Progress Notes (Signed)
Pt. Has had a steady decline in bp since arriving to unit, last bp 91/52.  Mid level provider made aware and IV fluids are ordered until the morning.  Will continue to monitor

## 2014-07-19 NOTE — Progress Notes (Addendum)
[place] TEAM 1 - Stepdown/ICU TEAM Progress Note  Beverly Carlson:[id number] DOB: 06/26/1945 DOA: 07/18/2014 PCP: Janace Hoard, MD  Admit HPI / Brief Narrative: 69 year old female with history of hypertension, diabetes, arthritis, and urinary incontinence who presented to the ED stating she'd been feeling generally weak for approximately 6 weeks with a decreased appetite with intermittent episodes where she has trouble swallowing solid foods but no difficulty swallowing liquids. Her weakness/fatingue progressed to the point she had to sit down 3x while simply walking out to her car from church. No dizziness, syncope, or cp. She went for follow-up appt at her PCP office and was sent to the ED due to hypotension.   ED course: cxr with questionable cavitary lung lesions which prompted a CT chest which showed bilateral patchy ground glass opacities and incidental PE.  Patient given ivf/rocephin/zitrho.  HPI/Subjective: The patient denies chest pain nausea vomiting or abdominal pain at the present time.  She does states she feels somewhat dyspneic.  She continues to cough intermittently but is not presently producing a significant amount of sputum.  She has multiple questions about her current treatment all of which I have answered.  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 cortisol within normal range (obtained prior to prednisone initiation)  Mild metabolic acidosis Likely low-grade lactic acidosis due to dehydration/hypotension - continue to volume resuscitate  Solid food dysphagia While this may simply represent an esophageal ring it is a worrisome symptom - we will begin our workup with a barium esophagram but if this is unrevealing endoscopy may be required to inspect the mucosa  DM2 Currently well-controlled - follow with initiation of steroids  Hx HTN  Equivocal UA Findings most likely due to dehydration - should be covered regardless with current very broad-spectrum antibiotics - follow-up culture  Obesity - Body mass index is 32.61 kg/(m^2).  Code Status: FULL Family Communication: no family present at time of exam Disposition Plan: SDU  Consultants: none  Procedures: 6/29 bilateral lower extremity venous duplex - negative DVT  Antibiotics: Azithromycin 6/28 > Ceftriaxone 6/28 > Fluconazole 6/28 (yeast infection)  DVT prophylaxis: Full dose lovenox   Objective: Blood pressure 102/53, pulse 85, temperature 98.2 F (36.8 C), temperature source Oral, resp. rate 25, height  (1.676 m), weight 91.6 kg (201 lb 15.1 oz), SpO2 95 %.  Intake/Output Summary (Last 24 hours) at 07/19/14 1128 Last data filed at 07/19/14 0800  Gross per 24 hour  Intake    990 ml  Output    625 ml  Net    365 ml   Exam: General: No acute respiratory distress when laying in bed  Lungs: mild scattered crackles - no wheeze  Cardiovascular: Regular rate and rhythm without murmur gallop or rub  Abdomen: Nontender, nondistended, soft, bowel sounds positive, no rebound, no ascites, no appreciable mass Extremities: No significant cyanosis, clubbing, or edema bilateral lower extremities  Data  Reviewed: Basic Metabolic Panel:  Recent Labs Lab 07/18/14 1502 07/19/14 0249  NA 130* 132*  K  5.0 5.1  CL 102 101  CO2  --  16*  GLUCOSE 159* 118*  BUN 36* 24*  CREATININE 1.40* 1.18*  CALCIUM  --  8.5*    CBC:  Recent Labs Lab 07/18/14 1251 07/18/14 1450 07/18/14 1502 07/19/14 0249  WBC 15.6* 13.9*  --  12.0*  NEUTROABS  --  5.1  --  4.2  HGB 12.4 11.9* 13.6 11.7*  HCT 38.9 35.3* 40.0 35.1*  MCV 85.6 85.1  --  85.8  PLT  --  436*  --  395    Liver Function Tests: No results for input(s): AST, ALT, ALKPHOS, BILITOT, PROT, ALBUMIN in the last 168 hours. No results for input(s): LIPASE, AMYLASE in the last 168 hours. No results for input(s): AMMONIA in the last 168 hours.  CBG:  Recent Labs Lab 07/18/14 2316 07/19/14 0812  GLUCAP 151* 125*    Recent Results (from the past 240 hour(s))  MRSA PCR Screening     Status: None   Collection Time: 07/18/14  8:00 PM  Result Value Ref Range Status   MRSA by PCR NEGATIVE NEGATIVE Final    Comment:        The GeneXpert MRSA Assay (FDA approved for NASAL specimens only), is one component of a comprehensive MRSA colonization surveillance program. It is not intended to diagnose MRSA infection nor to guide or monitor treatment for MRSA infections.      Studies:   Recent x-ray studies have been reviewed in detail by the Attending Physician  Scheduled Meds:  Scheduled Meds: . aspirin EC  81 mg Oral Daily  . enoxaparin (LOVENOX) injection  1 mg/kg Subcutaneous Q12H  . feeding supplement (ENSURE ENLIVE)  237 mL Oral BID BM  . fluticasone  1 spray Each Nare QHS  . insulin aspart  0-15 Units Subcutaneous TID WC  . pantoprazole  40 mg Oral Daily  . white petrolatum        Time spent on care of this patient: 35 mins   Trevionne Advani T , MD   [facility] Office  [phone] Pager - Text Page per Loretha StaplerAmion as per below:  On-Call/Text Page:      [email or web address]      password [email or web address]  If 7PM-7AM, please contact night-coverage [email or web address] Password [email or web address] 07/19/2014, 11:28 AM   LOS: 1 day

## 2014-07-19 NOTE — Progress Notes (Signed)
Initial Nutrition Assessment  DOCUMENTATION CODES:  Obesity unspecified  INTERVENTION:  Ensure Enlive (each supplement provides 350kcal and 20 grams of protein)  NUTRITION DIAGNOSIS:  Inadequate oral intake related to dysphagia as evidenced by per patient/family report.   GOAL:  Patient will meet greater than or equal to 90% of their needs   MONITOR:  PO intake, Supplement acceptance, Diet advancement, Labs, Weight trends, Skin, I & O's  REASON FOR ASSESSMENT:  Malnutrition Screening Tool    ASSESSMENT: This is a 69 year old female with past medical history significant for hypertension, diabetes, arthritis, urinary incontinence, presenting to the ED for hypertension. Patient states she's been feeling generally weak for approximately 6 weeks. She states is getting progressively worse. States she has had a decreased appetite-- states she will fix a normal sized plate of food for herself but is unable to finish it like she used to. States she does have episodes where she has trouble swallowing in which she feels that food is coming back up into her throat but she does not vomit. This mostly occurs with solid foods, has to chew for a long time before she can even begin to swallow. No difficulty swallowing liquids.   Pt admitted with CAP and pulmonary embolus.  Attempted to evaluate pt x2 , however, pt undergoing EKG and ultrasound at time of visit.   Per chart review, pt with poor po intake PTA, due to difficulty consuming solids. Per H&P, pt has no difficulty swallowing liquids. She has Ensure Enlive already ordered; RD agrees with order.   Per wt hx, UBW 245#. Noted a 15% wt loss over the past 8 months.   Unable to complete Nutrition-Focused physical exam at this time.   Height:  Ht Readings from Last 1 Encounters:  07/18/14 5\' 6"  (1.676 m)    Weight:  Wt Readings from Last 1 Encounters:  07/18/14 201 lb 15.1 oz (91.6 kg)    Ideal Body Weight:  59 kg  Wt  Readings from Last 10 Encounters:  07/18/14 201 lb 15.1 oz (91.6 kg)  07/18/14 198 lb (89.812 kg)  07/11/14 202 lb (91.627 kg)  10/20/13 235 lb (106.595 kg)  12/13/12 241 lb 6.4 oz (109.498 kg)  01/09/11 236 lb 6.4 oz (107.23 kg)  11/13/10 245 lb 4 oz (111.245 kg)  09/13/10 244 lb 6.4 oz (110.859 kg)  08/16/10 255 lb 6.4 oz (115.849 kg)    BMI:  Body mass index is 32.61 kg/(m^2).  Estimated Nutritional Needs:  Kcal:  1650-1850  Protein:  75-85 grams  Fluid:  1.6-1.8 L  Skin:  Reviewed, no issues  Diet Order:  Diet heart healthy/carb modified Room service appropriate?: Yes; Fluid consistency:: Thin  EDUCATION NEEDS:  No education needs identified at this time   Intake/Output Summary (Last 24 hours) at 07/19/14 1706 Last data filed at 07/19/14 0800  Gross per 24 hour  Intake    990 ml  Output    625 ml  Net    365 ml    Last BM:  PTA  Maysen Sudol A. Mayford KnifeWilliams, RD, LDN, CDE Pager: [phone] After hours Pager: [phone]

## 2014-07-19 NOTE — Progress Notes (Signed)
*  PRELIMINARY RESULTS* Vascular Ultrasound Lower extremity venous duplex has been completed.  Preliminary findings: negative for DVT in visualized veins.  Farrel DemarkJill Eunice, RDMS, RVT  07/19/2014, 4:37 PM

## 2014-07-20 DIAGNOSIS — IMO0002 Reserved for concepts with insufficient information to code with codable children: Secondary | ICD-10-CM | POA: Diagnosis present

## 2014-07-20 DIAGNOSIS — E872 Acidosis, unspecified: Secondary | ICD-10-CM | POA: Diagnosis present

## 2014-07-20 DIAGNOSIS — R1310 Dysphagia, unspecified: Secondary | ICD-10-CM | POA: Diagnosis present

## 2014-07-20 DIAGNOSIS — I10 Essential (primary) hypertension: Secondary | ICD-10-CM

## 2014-07-20 DIAGNOSIS — R938 Abnormal findings on diagnostic imaging of other specified body structures: Secondary | ICD-10-CM

## 2014-07-20 DIAGNOSIS — E1310 Other specified diabetes mellitus with ketoacidosis without coma: Secondary | ICD-10-CM

## 2014-07-20 DIAGNOSIS — E1165 Type 2 diabetes mellitus with hyperglycemia: Secondary | ICD-10-CM | POA: Diagnosis present

## 2014-07-20 DIAGNOSIS — I2699 Other pulmonary embolism without acute cor pulmonale: Secondary | ICD-10-CM

## 2014-07-20 DIAGNOSIS — E871 Hypo-osmolality and hyponatremia: Secondary | ICD-10-CM | POA: Diagnosis present

## 2014-07-20 DIAGNOSIS — J189 Pneumonia, unspecified organism: Secondary | ICD-10-CM

## 2014-07-20 DIAGNOSIS — R0902 Hypoxemia: Secondary | ICD-10-CM

## 2014-07-20 DIAGNOSIS — E1110 Type 2 diabetes mellitus with ketoacidosis without coma: Secondary | ICD-10-CM | POA: Diagnosis present

## 2014-07-20 DIAGNOSIS — R9389 Abnormal findings on diagnostic imaging of other specified body structures: Secondary | ICD-10-CM | POA: Diagnosis present

## 2014-07-20 DIAGNOSIS — I5032 Chronic diastolic (congestive) heart failure: Secondary | ICD-10-CM | POA: Diagnosis present

## 2014-07-20 LAB — COMPREHENSIVE METABOLIC PANEL
ALT: 10 U/L — ABNORMAL LOW (ref 14–54)
AST: 16 U/L (ref 15–41)
Albumin: 2.5 g/dL — ABNORMAL LOW (ref 3.5–5.0)
Alkaline Phosphatase: 88 U/L (ref 38–126)
Anion gap: 13 (ref 5–15)
BILIRUBIN TOTAL: 0.9 mg/dL (ref 0.3–1.2)
BUN: 15 mg/dL (ref 6–20)
CALCIUM: 8.1 mg/dL — AB (ref 8.9–10.3)
CHLORIDE: 104 mmol/L (ref 101–111)
CO2: 14 mmol/L — ABNORMAL LOW (ref 22–32)
Creatinine, Ser: 1 mg/dL (ref 0.44–1.00)
GFR calc Af Amer: >60 mL/min (ref >60)
GFR calc non Af Amer: 57 mL/min — ABNORMAL LOW (ref >60)
Glucose, Bld: 206 mg/dL — ABNORMAL HIGH (ref 65–99)
Potassium: 5.4 mmol/L — ABNORMAL HIGH (ref 3.5–5.1)
Sodium: 131 mmol/L — ABNORMAL LOW (ref 135–145)
Total Protein: 6 g/dL — ABNORMAL LOW (ref 6.5–8.1)

## 2014-07-20 LAB — GLUCOSE, CAPILLARY
GLUCOSE-CAPILLARY: 226 mg/dL — AB (ref 65–99)
Glucose-Capillary: 234 mg/dL — ABNORMAL HIGH (ref 65–99)
Glucose-Capillary: 258 mg/dL — ABNORMAL HIGH (ref 65–99)
Glucose-Capillary: 243 mg/dL — ABNORMAL HIGH (ref 65–99)

## 2014-07-20 LAB — MPO/PR-3 (ANCA) ANTIBODIES
ANCA Proteinase 3: <3.5 U/mL (ref 0.0–3.5)
Myeloperoxidase Abs: <9 U/mL (ref 0.0–9.0)

## 2014-07-20 LAB — CARDIOLIPIN ANTIBODIES, IGG, IGM, IGA
Anticardiolipin IgG: <9 GPL U/mL (ref 0–14)
Anticardiolipin IgM: <9 MPL U/mL (ref 0–12)

## 2014-07-20 LAB — PATHOLOGIST SMEAR REVIEW

## 2014-07-20 LAB — ANCA TITERS
Atypical P-ANCA titer: <1:20 {titer}
C-ANCA: <1:20 {titer}

## 2014-07-20 LAB — POTASSIUM: Potassium: 5.1 mmol/L (ref 3.5–5.1)

## 2014-07-20 LAB — CBC
HCT: 34.7 % — ABNORMAL LOW (ref 36.0–46.0)
Hemoglobin: 11.5 g/dL — ABNORMAL LOW (ref 12.0–15.0)
MCH: 28 pg (ref 26.0–34.0)
MCHC: 33.1 g/dL (ref 30.0–36.0)
MCV: 84.6 fL (ref 78.0–100.0)
PLATELETS: 363 10*3/uL (ref 150–400)
RBC: 4.1 MIL/uL (ref 3.87–5.11)
RDW: 13.7 % (ref 11.5–15.5)
WBC: 12.3 10*3/uL — AB (ref 4.0–10.5)

## 2014-07-20 LAB — LEGIONELLA ANTIGEN, URINE

## 2014-07-20 LAB — LACTIC ACID, PLASMA: LACTIC ACID, VENOUS: 0.9 mmol/L (ref 0.5–2.0)

## 2014-07-20 LAB — SEDIMENTATION RATE: SED RATE: 70 mm/h — AB (ref 0–22)

## 2014-07-20 LAB — ANGIOTENSIN CONVERTING ENZYME: Angiotensin-Converting Enzyme: <15 U/L (ref 14–82)

## 2014-07-20 LAB — HIV ANTIBODY (ROUTINE TESTING W REFLEX): HIV SCREEN 4TH GENERATION: NONREACTIVE

## 2014-07-20 LAB — IGE: IgE (Immunoglobulin E), Serum: 305 IU/mL — ABNORMAL HIGH (ref 0–100)

## 2014-07-20 MED ORDER — INSULIN REGULAR BOLUS VIA INFUSION: 0.0000 [IU] | Freq: Three times a day (TID) | INTRAVENOUS | Status: DC

## 2014-07-20 MED ORDER — DEXTROSE 50 % IV SOLN: 25.0000 mL | INTRAVENOUS | Status: DC | PRN

## 2014-07-20 MED ORDER — INSULIN GLARGINE 100 UNIT/ML ~~LOC~~ SOLN
10.0000 [IU] | Freq: Every day | SUBCUTANEOUS | Status: DC
  Administered 2014-07-20 – 2014-07-21 (×2): 10 [IU] via SUBCUTANEOUS
  Filled 2014-07-20 (×2): qty 0.1

## 2014-07-20 MED ORDER — DEXTROSE-NACL 5-0.45 % IV SOLN: INTRAVENOUS | Status: DC

## 2014-07-20 MED ORDER — SODIUM CHLORIDE 0.9 % IV SOLN: INTRAVENOUS | Status: DC

## 2014-07-20 MED ORDER — SODIUM CHLORIDE 0.9 % IV SOLN
INTRAVENOUS | Status: DC
  Filled 2014-07-20: qty 2.5

## 2014-07-20 MED ORDER — METHYLPREDNISOLONE SODIUM SUCC 125 MG IJ SOLR
60.0000 mg | Freq: Four times a day (QID) | INTRAMUSCULAR | Status: DC
  Administered 2014-07-20 – 2014-07-22 (×7): 60 mg via INTRAVENOUS
  Filled 2014-07-20 (×4): qty 0.96
  Filled 2014-07-20 (×2): qty 2
  Filled 2014-07-20 (×2): qty 0.96
  Filled 2014-07-20: qty 2
  Filled 2014-07-20 (×2): qty 0.96

## 2014-07-20 NOTE — Progress Notes (Signed)
Inpatient Diabetes Program Recommendations  AACE/ADA: New Consensus Statement on Inpatient Glycemic Control (2013)  Target Ranges:  Prepandial:   less than 140 mg/dL      Peak postprandial:   less than 180 mg/dL (1-2 hours)      Critically ill patients:  140 - 180 mg/dL    Inpatient Diabetes Program Recommendations Insulin - Basal: Home lantus-50 units, Fasting this am higher than last HS. Please consider increase in lantus to 30 units-can titrate up as needed to normalize fasting Insulin - Meal Coverage: Patient takes novolog 16 units in the am 28 units with supper. Please consider addition of meal  coverage especially while on prednisone which tends to increase the post-prandials.. Could start with 4 units novolog tidwc (given only if pt eats at least 50% of meal. (in addition to correction scale as ordered)  Thank you Lenor CoffinAnn Kourtnie Sachs, RN, MSN, CDE  Diabetes Inpatient Program Office: [phone] Pager: [phone] 8:00 am to 5:00 pm

## 2014-07-20 NOTE — Care Management (Signed)
Important Message  Patient Details  Name: Beverly Carlson MRN: [id number] Date of Birth: 1945-03-05   Medicare Important Message Given:  Yes-second notification given    Orson AloeMegan P Kodey Xue 07/20/2014, 10:10 AM

## 2014-07-20 NOTE — Progress Notes (Signed)
[place] TEAM 1 - Stepdown/ICU TEAM Progress Note  Beverly Carlson:[id number] DOB: Dec 02, 1945 DOA: 07/18/2014 PCP: Janace Hoard, MD  Admit HPI / Brief Narrative: 69 year old female with history of hypertension, Diabetes type II uncontrolled, arthritis, and urinary incontinence  Presented to the ED stating she'd been feeling generally weak for approximately 6 weeks with a decreased appetite with intermittent episodes where she has trouble swallowing solid foods but no difficulty swallowing liquids. Her weakness/fatingue progressed to the point she had to sit down 3x while simply walking out to her car from church. No dizziness, syncope, or cp. She went for follow-up appt at her PCP office and was sent to the ED due to hypotension.   ED course: cxr with questionable cavitary lung lesions which prompted a CT chest which showed bilateral patchy ground glass opacities and incidental PE. Patient given ivf/rocephin/zitrho.   HPI/Subjective: 6/30 A/O 4, sitting comfortably in bed eating lunch. States she has been having positive dysphagia times proximally 6 weeks. Negative CP, negative SOB, negative DOE, negative hemoptysis, negative hematemesis. Patient states has not taken a long trip by plane/car recently. Negative previous PE/DVT.   Assessment/Plan: Pulmonary emboli  -Continue full dose anticoagulation - no clear inciting etiology - workup continues -Anticardiolipin, factor V Leiden, prothrombin gene pending -Currently asymptomatic but monitor closely for increased O2 demand  Multifocal regions of ground glass pulmonary opacity as well as ground glass nodules bilateral lungs with mild mediastinal adenopathy -Radiographic findings and history are in fact suggestive of idiopathic acute eosinophilic pneumonia  -ANCA , Mpo/pr3, IgE; R/O pulmonary vasculitis from the differential  -Continue prednisone 40 mg daily -Consult to PCCM ( eosinophilic pneumonia  vs ILD?)  Chronic diastolic  CHF (Mild)  -Patient's BP soft, would not add any medication at this time.  Essential HTN -Controlled  DKA/diabetes type 2 uncontrolled  -Lantus 10 units daily -Moderate SSI -6/21 hemoglobin A1c= 9.3 -Lipid panel pending  Hyponatremia / Dehydration / Hypotension -Patient continues to have anion gap secondary to her mild DKA  -See DKA    Mild metabolic acidosis Likely low-grade lactic acidosis due to dehydration/hypotension - continue to volume resuscitate  Solid food dysphagia -While this may simply represent an esophageal ring it is a worrisome symptom - Barium Esophagram pending; if unrevealing endoscopy may be required to inspect the mucosa  Equivocal UA -Findings most likely due to dehydration - should be covered regardless with current very broad-spectrum antibiotics - follow-up culture       Code Status: FULL Family Communication: no family present at time of exam Disposition Plan:     Consultants: [facility]  Procedure/Significant Events: 6/29 echocardiogram;- LVEF= 60% to 65%.- (grade 1 diastolic dysfunction).   Culture   Antibiotics: Azithromycin 6/28 > 1 dose Ceftriaxone 6/28 > 1 dose Fluconazole 6/28 (yeast infection)  DVT prophylaxis: Lovenox   Devices   LINES / TUBES:      Continuous Infusions: . sodium chloride 100 mL/hr at 07/20/14 0107    Objective: VITAL SIGNS: Temp: 97.8 F (36.6 C) (06/30 1138) Temp Source: Oral (06/30 1138) BP: 103/64 mmHg (06/30 1138) Pulse Rate: 86 (06/30 1138) SPO2; FIO2:   Intake/Output Summary (Last 24 hours) at 07/20/14 1603 Last data filed at 07/20/14 1515  Gross per 24 hour  Intake 2110.01 ml  Output   1250 ml  Net 860.01 ml     Exam: General: A/O 4, NAD, No acute respiratory distress, negative rashes/joint pain Eyes: Negative headache, eye pain, double vision, negative retinal hemorrhage ENT: Negative Runny  nose, negative ear pain, negative tinnitus, negative gingival bleeding,  positive dysphagia  Neck:  Negative scars, masses, torticollis, lymphadenopathy, JVD Lungs: Clear to auscultation bilaterally positive by basilar crackles, negative wheezing  Cardiovascular: Regular rate and rhythm without murmur gallop or rub normal S1 and S2 Abdomen:negative abdominal pain, negative dysphagia, Nontender, nondistended, soft, bowel sounds positive, no rebound, no ascites, no appreciable mass Extremities: No significant cyanosis, clubbing, or edema bilateral lower extremities, negative pain to palpation Psychiatric:  Negative depression, negative anxiety, negative fatigue, negative mania  Neurologic:  Cranial nerves II through XII intact, tongue/uvula midline, all extremities muscle strength 5/5, sensation intact throughout, negative dysarthria, negative expressive aphasia, negative receptive aphasia.      Data Reviewed: Basic Metabolic Panel:  Recent Labs Lab 07/18/14 1502 07/19/14 0249 07/20/14 0259 07/20/14 0910  NA 130* 132* 131*  --   K 5.0 5.1 5.4* 5.1  CL 102 101 104  --   CO2  --  16* 14*  --   GLUCOSE 159* 118* 206*  --   BUN 36* 24* 15  --   CREATININE 1.40* 1.18* 1.00  --   CALCIUM  --  8.5* 8.1*  --    Liver Function Tests:  Recent Labs Lab 07/20/14 0259  AST 16  ALT 10*  ALKPHOS 88  BILITOT 0.9  PROT 6.0*  ALBUMIN 2.5*   No results for input(s): LIPASE, AMYLASE in the last 168 hours. No results for input(s): AMMONIA in the last 168 hours. CBC:  Recent Labs Lab 07/18/14 1251 07/18/14 1450 07/18/14 1502 07/19/14 0249 07/20/14 0259  WBC 15.6* 13.9*  --  12.0* 12.3*  NEUTROABS  --  5.1  --  4.2  --   HGB 12.4 11.9* 13.6 11.7* 11.5*  HCT 38.9 35.3* 40.0 35.1* 34.7*  MCV 85.6 85.1  --  85.8 84.6  PLT  --  436*  --  395 363   Cardiac Enzymes: No results for input(s): CKTOTAL, CKMB, CKMBINDEX, TROPONINI in the last 168 hours. BNP (last 3 results) No results for input(s): BNP in the last 8760 hours.  ProBNP (last 3 results) No  results for input(s): PROBNP in the last 8760 hours.  CBG:  Recent Labs Lab 07/19/14 1611 07/19/14 2109 07/19/14 2309 07/20/14 0734 07/20/14 1138  GLUCAP 99 126* 166* 234* 258*    Recent Results (from the past 240 hour(s))  Culture, blood (routine x 2)     Status: None (Preliminary result)   Collection Time: 07/18/14  6:42 PM  Result Value Ref Range Status   Specimen Description BLOOD RIGHT HAND  Final   Special Requests BOTTLES DRAWN AEROBIC ONLY 2CC  Final   Culture NO GROWTH < 24 HOURS  Final   Report Status PENDING  Incomplete  MRSA PCR Screening     Status: None   Collection Time: 07/18/14  8:00 PM  Result Value Ref Range Status   MRSA by PCR NEGATIVE NEGATIVE Final    Comment:        The GeneXpert MRSA Assay (FDA approved for NASAL specimens only), is one component of a comprehensive MRSA colonization surveillance program. It is not intended to diagnose MRSA infection nor to guide or monitor treatment for MRSA infections.      Studies:  Recent x-ray studies have been reviewed in detail by the Attending Physician  Scheduled Meds:  Scheduled Meds: . aspirin EC  81 mg Oral Daily  . enoxaparin (LOVENOX) injection  1 mg/kg Subcutaneous Q12H  . feeding supplement (ENSURE  ENLIVE)  237 mL Oral BID BM  . fluticasone  1 spray Each Nare QHS  . insulin aspart  0-15 Units Subcutaneous TID WC  . insulin glargine  10 Units Subcutaneous Daily  . pantoprazole  40 mg Oral Daily  . predniSONE  40 mg Oral Q breakfast    Time spent on care of this patient: 40 mins   WOODS, Roselind Messier , MD  [facility] Office  [phone] Pager [phone]  On-Call/Text Page:      [email or web address]      password [email or web address]  If 7PM-7AM, please contact night-coverage [email or web address] Password [email or web address] 07/20/2014, 4:03 PM   LOS: 2 days   Care during the described time interval was provided by me .  I have reviewed this patient's available data, including medical history, events of note,  physical examination, and all test results as part of my evaluation. I have personally reviewed and interpreted all radiology studies.   Carolyne Littles, MD [phone] Pager

## 2014-07-20 NOTE — Consult Note (Addendum)
Name: Beverly Carlson MRN: [id number] DOB: Jun 11, 1945    ADMISSION DATE:  07/18/2014 CONSULTATION DATE:  07/20/14  REFERRING MD :  Dr. Joseph Art   CHIEF COMPLAINT:  Abnormal CT Chest  BRIEF PATIENT DESCRIPTION: 69 y/o F admitted 6/28 for hypotension, weakness, loss of appetite and difficulty swallowing.  CXR showed concern for cavitary lesion and follow up CT of the chest was obtained which showed   SIGNIFICANT EVENTS  6/28  Admit  6/30  PCCM consulted for pulmonary evaluation   STUDIES:  6/28  CT of Chest >> no cavitary lesions, GGO/GG nodules upper lobe predominant distrubution, incidental finding of pulmonary embolus in the segmental R LL pulmonary artery, focal consolidation in RLL (? Pulmonary infarct), mild mediastinal adenopathy    HISTORY OF PRESENT ILLNESS:  69 y/o F, former smoker (0.5 ppd x 7 years, quit 1978) with a PMH of DM, HTN, HLD, knee arthritis, polymyositis on 2.5 mg of prednisone (dx in 2008 by biopsy, followed at [facility]) and back surgery who was admitted on 6/28 from an urgent care for hypotension, weakness, loss of appetite, trouble swallowing/reflux of food.    She was treated with IVF resuscitation with improvement in BP & admitted per [facility] for work up.  Initial CXR was concerning for cavitary lung lesions which prompted a CT of the Chest with contrast which showed no cavitary lesions, GGO/GG nodules upper lobe predominant distrubution, incidental finding of pulmonary embolus in the segmental R LL pulmonary artery, focal consolidation in RLL (? Pulmonary infarct), & mild mediastinal adenopathy.   CBC - WBC 12.0, hgb 11.7, platelets 395, differential with 53% eosinophils.  Sed Rate 70.    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 She grew up in [place].  She has not traveled, has no sick contacts, does not own Orthoptist (specifically birds) and has had no known exposures.    She denies known fevers, chills, nausea, diarrhea, shortness of  breath, cough, sputum production and chest pain.  She has had issues with seasonal allergies in the past few months with clear sinus drainage & watery eyes.  She has noted 2-3 weeks of weakness, decreased appetite, difficulty swallowing solids, dry heaves and reflux of food.  PCCM consulted for evaluation of abnormal CT.     PAST MEDICAL HISTORY :   has a past medical history of Diabetes mellitus; Hypertension; Bilateral swelling of feet; Arthritis; and Incontinence.  has past surgical history that includes Back surgery (2003) and Abdominal hysterectomy (1982).   Prior to Admission medications   Medication Sig Start Date End Date Taking? Authorizing Provider  aspirin 81 MG tablet Take 81 mg by mouth daily.   Yes Historical Provider, MD  bisoprolol-hydrochlorothiazide (ZIAC) 10-6.25 MG per tablet Take 1 tablet by mouth daily. 05/30/14  Yes Reather Littler, MD  colesevelam [facility]) 625 MG tablet Take 3 tablets twice a day 08/29/13  Yes Reather Littler, MD  fluticasone (FLONASE) 50 MCG/ACT nasal spray Use 2 sprays in each nostril once daily before bedtime for the congestion and postnasal drainage Patient taking differently: Place 1 spray into both nostrils at bedtime.  07/11/14  Yes Peyton Najjar, MD  insulin aspart (NOVOLOG FLEXPEN) 100 UNIT/ML FlexPen Inject 16 units in the morning and 28 units in the evening. 03/30/14  Yes Reather Littler, MD  insulin glargine (LANTUS) 100 UNIT/ML injection INJECT 0.5 MLS (50 UNITS TOTAL) INTO THE SKIN AT BEDTIME. 08/01/13  Yes Reather Littler, MD  metFORMIN (GLUCOPHAGE-XR) 500 MG 24  hr tablet TAKE 2 TABLETS TWICE A DAY 07/18/14  Yes Reather Littler, MD  pantoprazole (PROTONIX) 40 MG tablet Take one pill daily for acid reflux Patient taking differently: Take 40 mg by mouth daily.  07/11/14  Yes Peyton Najjar, MD  predniSONE (DELTASONE) 2.5 MG tablet Take 2.5 mg by mouth daily. 04/22/14  Yes Historical Provider, MD  ramipril (ALTACE) 10 MG capsule TAKE ONE CAPSULE DAILY 05/09/14  Yes Reather Littler, MD  fluconazole (DIFLUCAN) 150 MG tablet Take 1 pill single dose for yeast infection Patient not taking: Reported on 07/18/2014 07/11/14   Peyton Najjar, MD   Allergies  Allergen Reactions  . Codeine Anxiety    FAMILY HISTORY:  family history includes Diabetes in her mother, sister, and sister; Heart disease in her mother and sister; Mental illness in her father.   SOCIAL HISTORY:  reports that she quit smoking about 34 years ago. She has never used smokeless tobacco. She reports that she does not drink alcohol or use illicit drugs.  REVIEW OF SYSTEMS:   Constitutional: Negative for fever, chills, weight loss, and diaphoresis. Positive for weakness / fatigue.  HENT: Negative for hearing loss, ear pain, nosebleeds, congestion, sore throat, neck pain, tinnitus and ear discharge.  Eyes: Negative for blurred vision, double vision, photophobia, pain, discharge and redness.  Respiratory: Negative for cough, hemoptysis, sputum production, shortness of breath, wheezing and stridor.   Cardiovascular: Negative for chest pain, palpitations, orthopnea, claudication, leg swelling and PND.  Gastrointestinal: Negative for heartburn, nausea, vomiting, abdominal pain, diarrhea, constipation, blood in stool and melena. Positive for difficulty swallowing.   Genitourinary: Negative for dysuria, urgency, frequency, hematuria and flank pain.  Musculoskeletal: Negative for myalgias, back pain, joint pain and falls.  Skin: Negative for itching and rash.  Neurological: Negative for dizziness, tingling, tremors, sensory change, speech change, focal weakness, seizures, loss of consciousness, weakness and headaches.  Endo/Heme/Allergies: Negative for environmental allergies and polydipsia. Does not bruise/bleed easily.   SUBJECTIVE:   VITAL SIGNS: Temp:  [97.3 F (36.3 C)-99.2 F (37.3 C)] 97.8 F (36.6 C) (06/30 1138) Pulse Rate:  [77-102] 86 (06/30 1138) Resp:  [12-26] 12 (06/30 1138) BP:  (96-128)/(52-69) 103/64 mmHg (06/30 1138) SpO2:  [95 %-98 %] 97 % (06/30 1138)  PHYSICAL EXAMINATION: General:  wdwn adult female in NAD  Neuro:  AAOx4, speech clear, MAE  HEENT:  MM pink/moist, no JVD Cardiovascular:  s1s2 rrr, no m/r/g  Lungs:  resp's even/non-labored, basilar crackles on R lower posterior, clear on L  Abdomen:  Obese/soft, bsx4 active  Musculoskeletal:  No acute deformities Skin:  Warm/dry, no edema    Recent Labs Lab 07/18/14 1502 07/19/14 0249 07/20/14 0259 07/20/14 0910  NA 130* 132* 131*  --   K 5.0 5.1 5.4* 5.1  CL 102 101 104  --   CO2  --  16* 14*  --   BUN 36* 24* 15  --   CREATININE 1.40* 1.18* 1.00  --   GLUCOSE 159* 118* 206*  --     Recent Labs Lab 07/18/14 1450 07/18/14 1502 07/19/14 0249 07/20/14 0259  HGB 11.9* 13.6 11.7* 11.5*  HCT 35.3* 40.0 35.1* 34.7*  WBC 13.9*  --  12.0* 12.3*  PLT 436*  --  395 363   Ct Soft Tissue Neck W Contrast  07/18/2014   CLINICAL DATA:  Loss of appetite, difficulty swallowing. Weakness for 6 weeks.  EXAM: CT NECK WITH CONTRAST  TECHNIQUE: Multidetector CT imaging of the neck was  performed using the standard protocol following the bolus administration of intravenous contrast.  CONTRAST:  75mL OMNIPAQUE IOHEXOL 300 MG/ML  SOLN  COMPARISON:  Cervical spine CT 11/29/2013  FINDINGS: Pharynx and larynx: Unremarkable.  Salivary glands: Unremarkable.  Thyroid: Unremarkable.  Lymph nodes: No cervical adenopathy.  Vascular: Vessels widely patent.  Limited intracranial: Unremarkable.  Visualized orbits: Unremarkable.  Mastoids and visualized paranasal sinuses: Clear.  Skeleton: Prior anterior fusion from C4-C7 with corpectomy at C5 and C6. No acute bony abnormality.  Upper chest: Extensive ground-glass opacities and areas of consolidation in both upper lobes. No visible effusions.  Other: None.  IMPRESSION: Extensive patchy airspace disease and ground-glass opacities in both upper lobes, presumably infectious or  inflammatory. Recommend correlation with chest CT.  No acute findings in the neck.   Electronically Signed   By: Charlett Nose M.D.   On: 07/18/2014 17:14   Ct Chest W Contrast  07/18/2014   CLINICAL DATA:  Abnormal chest x-ray. Patient report weakness for 6 weeks. Difficulty swallowing and loss of appetite.  EXAM: CT CHEST WITH CONTRAST  TECHNIQUE: Multidetector CT imaging of the chest was performed during intravenous contrast administration.  CONTRAST:  75mL OMNIPAQUE IOHEXOL 300 MG/ML  SOLN  COMPARISON:  Chest radiograph earlier this day.  Chest CT 11/29/2013  FINDINGS: There are no cavitary lesions corresponding to questioned abnormalities on CT. There multifocal geographic regions of ground-glass opacity as well as ground-glass nodules throughout both lungs, with a peripheral and upper lobe predominant distribution. Slightly more confluent consolidation in the right lower lobe. No definite bronchiectasis. Trachea and bronchi are patent.  Mild mediastinal adenopathy. For example, right lower paratracheal lymph node measures 1.2 cm short axis dimension. Additional mildly enlarged and prominent lymph nodes in the prevascular, lower paratracheal and subcarinal station. No definite hilar adenopathy. Decreased size pericardial effusion from prior exam. There is no pleural effusion. The thoracic aorta is normal in caliber without aneurysm or dissection. There are valvular calcifications at the aortic valve.  The esophagus is unremarkable by CT.  Not performed as a dedicated pulmonary arterial study, however there are filling defects in the segmental right lower lobe pulmonary artery. No evidence of right heart strain.  There is no axillary adenopathy. There is no acute abnormality in the included upper abdomen.  There are no acute or suspicious osseous abnormalities. Degenerative change noted in the spine.  IMPRESSION: 1. No cavitary lesions. The questioned abnormalities on radiograph correspond to geographic  ground-glass opacities and ground-glass nodules. This is in an upper lobe and peripheral predominant distribution. Differential considerations are broad and include interstitial pneumonia, eosinophilic pneumonia, or atypical infection. 2. Incidental finding of pulmonary embolus in the segmental right lower lobe pulmonary artery. A more focal consolidation at the right lung base may reflect pulmonary infarct. 3. Mild mediastinal adenopathy. These results were called by telephone at the time of interpretation on 07/18/2014 at 5:26 pm to Dr. Doug Sou, who verbally acknowledged these results.   Electronically Signed   By: Rubye Oaks M.D.   On: 07/18/2014 17:30    ASSESSMENT / PLAN:  Abnormal CT of Chest / Diffuse Ground Glass Opacities  - diffuse GGO, upper lobe predominant.  No infectious prodrome (cough, fevers, chills, etc).  Baseline immune suppressed on prednisone and is not on prophylactic antibiotics. DDx includes non-infectious pneumonitis, aspiration, eosinophilic PNA, BOOP/atypical infection,   Plan: Increase steroids to 60 mg solu-medrol Q6 for 72 hours with repeat CXR.  If not clearing, proceed with FOB Intermittent CXR [facility]  to d/c abx Will need follow up in the Pulmonary office  Pulmonary Embolism   Plan: Lovenox SQ with transition to oral agent of choice Plan for 6-9 months anti-coagulation   Dysphagia - difficulty swallowing solids  Plan: Recommend SLP  Pending DG Esophagus   Canary BrimBrandi Ollis, NP-C [place] Pgr: [phone] or if no answer [phone] 07/20/2014, 3:14 PM  Attending Note:  69 year old female presenting with SOB.  Extensive work up done with ground glass infiltrate noted subpleurally specially on the AP views of the CT.  Eosinophil % count 53% and IgE is 302.  Patient does not complain of SOB, just of feeling drained.  I reviewed the chest CT myself, evidence of subpleural GGO.  Discussed with PCCM-NP and TRH-MD.  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  Other differential diagnosis would include churg-strauss syndrome and ABPA but are less likely.  - Solumedrol 60 mg IV q6 hours x72 hours.  - F/U imaging after 72 hours to see if resolving.  If resolving then will continue steroids after conversion to PO and can go home with a taper dose and perform allergy testing.  If worsening then will bronchoscope.  PNA: I see no evidence of infection.   - Stop abx.  - F/U on cultures.  Elevated eosinophil count: as above.  - Steroids as ordered.  - Will repeat in 3 days CBC with diff.  Hypoxemia: improving.  - Titrate O2 to off.  - Hopefully can get off O2 all together.  Pulmonary Embolism:  - Coumadin x6 months.  - Heparin now.  Dysphagia: new  - Swallow evaluation.  - Caution with swallowing, chin tuck in.  Patient seen and examined, agree with above note.  I dictated the care and orders written for this patient under my direction.  Alyson ReedyWesam G Yacoub, MD [phone]

## 2014-07-21 ENCOUNTER — Inpatient Hospital Stay (HOSPITAL_COMMUNITY): Payer: Medicare Other

## 2014-07-21 LAB — GLUCOSE, CAPILLARY
GLUCOSE-CAPILLARY: 308 mg/dL — AB (ref 65–99)
Glucose-Capillary: 386 mg/dL — ABNORMAL HIGH (ref 65–99)
Glucose-Capillary: 348 mg/dL — ABNORMAL HIGH (ref 65–99)
Glucose-Capillary: 347 mg/dL — ABNORMAL HIGH (ref 65–99)

## 2014-07-21 LAB — LIPID PANEL
CHOL/HDL RATIO: 15.8 ratio
Cholesterol: 252 mg/dL — ABNORMAL HIGH (ref 0–200)
HDL: 16 mg/dL — ABNORMAL LOW (ref >40)
LDL Cholesterol: 198 mg/dL — ABNORMAL HIGH (ref 0–99)
Triglycerides: 189 mg/dL — ABNORMAL HIGH (ref <150)
VLDL: 38 mg/dL (ref 0–40)

## 2014-07-21 MED ORDER — INSULIN GLARGINE 100 UNIT/ML ~~LOC~~ SOLN
32.0000 [IU] | Freq: Every day | SUBCUTANEOUS | Status: DC
  Administered 2014-07-22: 32 [IU] via SUBCUTANEOUS
  Filled 2014-07-21: qty 0.32

## 2014-07-21 MED ORDER — INSULIN ASPART 100 UNIT/ML ~~LOC~~ SOLN
0.0000 [IU] | Freq: Three times a day (TID) | SUBCUTANEOUS | Status: DC
  Administered 2014-07-22: 11 [IU] via SUBCUTANEOUS
  Administered 2014-07-22: 4 [IU] via SUBCUTANEOUS
  Administered 2014-07-22: 20 [IU] via SUBCUTANEOUS
  Administered 2014-07-23: 15 [IU] via SUBCUTANEOUS
  Administered 2014-07-23: 3 [IU] via SUBCUTANEOUS
  Administered 2014-07-23: 7 [IU] via SUBCUTANEOUS
  Administered 2014-07-24 (×2): 4 [IU] via SUBCUTANEOUS

## 2014-07-21 MED ORDER — INSULIN ASPART 100 UNIT/ML ~~LOC~~ SOLN
0.0000 [IU] | Freq: Every day | SUBCUTANEOUS | Status: DC
  Administered 2014-07-21: 4 [IU] via SUBCUTANEOUS
  Administered 2014-07-22: 2 [IU] via SUBCUTANEOUS

## 2014-07-21 NOTE — Progress Notes (Signed)
[facility] TEAM 1 - Stepdown/ICU TEAM Progress Note  Beverly CIARAVINO ZOX:[id number] DOB: 1945/06/15 DOA: 07/18/2014 PCP: Janace Hoard, MD  Admit HPI / Brief Narrative: 69 year old female with history of hypertension, diabetes, arthritis, and urinary incontinence who presented to the ED stating she'd been feeling generally weak for approximately 6 weeks with a decreased appetite with intermittent episodes where she has trouble swallowing solid foods but no difficulty swallowing liquids. Her weakness/fatingue progressed to the point she had to sit down 3x while simply walking out to her car from church. No dizziness, syncope, or cp. She went for follow-up appt at her PCP office and was sent to the ED due to hypotension.   ED course: cxr with questionable cavitary lung lesions which prompted a CT chest which showed bilateral patchy ground glass opacities and incidental PE.  Patient given ivf/rocephin/zitrho.  HPI/Subjective: The patient states she is feeling much better in general.  She presently does not feel short of breath.  She denies chest pain nausea vomiting or abdominal pain.  Assessment/Plan:  Pulmonary emboli  Continue full dose anticoagulation - no clear inciting etiology - workup continues  Multifocal regions of ground glass pulmonary opacity as well as ground glass nodules bilateral lungs with mild mediastinal adenopathy Radiographic findings and history are in fact suggestive of idiopathic acute eosinophilic pneumonia - ANCA negative - Pulmonary is following - steroids increased to higher IV dosing to complete 72 hours of tx w/ planned re-imaging - if not improving then will need bronch   Hyponatremia / Dehydration / Hypotension Improving with simple hydration - a.m. cortisol within normal range (obtained prior to prednisone initiation) - recheck in a.m. - hypotension resolved with volume expansion  Mild metabolic acidosis Recheck in a.m.  Solid food dysphagia Esophagram  notes mildly advanced esophageal dysmotility for age with small transient hiatal hernia but no other abnormalities - resume diet and follow symptoms  DM2 CBGs very poorly controlled with addition of high-dose steroids - adjust treatment plan and follow  Hx HTN Blood pressure currently well controlled  Equivocal UA Findings most likely due to dehydration - should be covered regardless with current very broad-spectrum antibiotics - follow-up culture  Obesity - Body mass index is 32.61 kg/(m^2).   HLD LDL 198? - f/u LFTs in AM - potentially start medical tx   Code Status: FULL Family Communication: no family present at time of exam Disposition Plan: SDU  Consultants: Pulmonary  Procedures: 6/29 bilateral lower extremity venous duplex - negative DVT  Antibiotics: Azithromycin 6/28  Ceftriaxone 6/28  Fluconazole 6/28 (yeast infection)  DVT prophylaxis: Full dose lovenox   Objective: Blood pressure 105/62, pulse 88, temperature 97.8 F (36.6 C), temperature source Oral, resp. rate 18, height  (1.676 m), weight 91.6 kg (201 lb 15.1 oz), SpO2 96 %.  Intake/Output Summary (Last 24 hours) at 07/21/14 1740 Last data filed at 07/21/14 0602  Gross per 24 hour  Intake 2683.33 ml  Output    450 ml  Net 2233.33 ml   Exam: General: No acute respiratory distress - alert and conversant   Lungs: CTA th/o w/o wheeze or focal crackles  Cardiovascular: Regular rate and rhythm without murmur gallop or rub  Abdomen: Nontender, nondistended, soft, bowel sounds positive, no rebound, no ascites, no appreciable mass Extremities: No significant cyanosis, clubbing, edema bilateral lower extremities  Data Reviewed: Basic Metabolic Panel:  Recent Labs Lab 07/18/14 1502 07/19/14 0249 07/20/14 0259 07/20/14 0910  NA 130* 132* 131*  --   K 5.0  5.1 5.4* 5.1  CL 102 101 104  --   CO2  --  16* 14*  --   GLUCOSE 159* 118* 206*  --   BUN 36* 24* 15  --   CREATININE 1.40* 1.18* 1.00  --    CALCIUM  --  8.5* 8.1*  --     CBC:  Recent Labs Lab 07/18/14 1251 07/18/14 1450 07/18/14 1502 07/19/14 0249 07/20/14 0259  WBC 15.6* 13.9*  --  12.0* 12.3*  NEUTROABS  --  5.1  --  4.2  --   HGB 12.4 11.9* 13.6 11.7* 11.5*  HCT 38.9 35.3* 40.0 35.1* 34.7*  MCV 85.6 85.1  --  85.8 84.6  PLT  --  436*  --  395 363   Liver Function Tests:  Recent Labs Lab 07/20/14 0259  AST 16  ALT 10*  ALKPHOS 88  BILITOT 0.9  PROT 6.0*  ALBUMIN 2.5*   CBG:  Recent Labs Lab 07/20/14 1138 07/20/14 1706 07/20/14 2118 07/21/14 0726 07/21/14 1109  GLUCAP 258* 226* 243* 386* 348*    Recent Results (from the past 240 hour(s))  Culture, blood (routine x 2)     Status: None (Preliminary result)   Collection Time: 07/18/14  6:42 PM  Result Value Ref Range Status   Specimen Description BLOOD RIGHT HAND  Final   Special Requests BOTTLES DRAWN AEROBIC ONLY 2CC  Final   Culture NO GROWTH 3 DAYS  Final   Report Status PENDING  Incomplete  MRSA PCR Screening     Status: None   Collection Time: 07/18/14  8:00 PM  Result Value Ref Range Status   MRSA by PCR NEGATIVE NEGATIVE Final    Comment:        The GeneXpert MRSA Assay (FDA approved for NASAL specimens only), is one component of a comprehensive MRSA colonization surveillance program. It is not intended to diagnose MRSA infection nor to guide or monitor treatment for MRSA infections.   Culture, blood (routine x 2)     Status: None (Preliminary result)   Collection Time: 07/18/14  8:30 PM  Result Value Ref Range Status   Specimen Description BLOOD LEFT HAND  Final   Special Requests IN PEDIATRIC BOTTLE 4CC  Final   Culture NO GROWTH 2 DAYS  Final   Report Status PENDING  Incomplete     Studies:   Recent x-ray studies have been reviewed in detail by the Attending Physician  Scheduled Meds:  Scheduled Meds: . aspirin EC  81 mg Oral Daily  . enoxaparin (LOVENOX) injection  1 mg/kg Subcutaneous Q12H  . feeding  supplement (ENSURE ENLIVE)  237 mL Oral BID BM  . fluticasone  1 spray Each Nare QHS  . insulin aspart  0-15 Units Subcutaneous TID WC  . insulin glargine  10 Units Subcutaneous Daily  . methylPREDNISolone (SOLU-MEDROL) injection  60 mg Intravenous Q6H  . pantoprazole  40 mg Oral Daily    Time spent on care of this patient: 35 mins   Ameris Akamine Carlson , MD   [facility] Office  [phone] Pager - Text Page per Loretha StaplerAmion as per below:  On-Call/Text Page:      [email or web address]      password [email or web address]  If 7PM-7AM, please contact night-coverage [email or web address] Password [email or web address] 07/21/2014, 5:40 PM   LOS: 3 days

## 2014-07-21 NOTE — Consult Note (Signed)
Name: Beverly Carlson MRN: [id number] DOB: Dec 25, 1945    ADMISSION DATE:  07/18/2014 CONSULTATION DATE:  07/20/14  REFERRING MD :  Dr. Joseph Art   CHIEF COMPLAINT:  Abnormal CT Chest  BRIEF PATIENT DESCRIPTION: 69 y/o F admitted 6/28 for hypotension, weakness, loss of appetite and difficulty swallowing.  CXR showed concern for cavitary lesion and follow up CT of the chest was obtained which showed   SIGNIFICANT EVENTS  6/28  Admit  6/30  PCCM consulted for pulmonary evaluation   STUDIES:  6/28  CT of Chest >> no cavitary lesions, GGO/GG nodules upper lobe predominant distrubution, incidental finding of pulmonary embolus in the segmental R LL pulmonary artery, focal consolidation in RLL (? Pulmonary infarct), mild mediastinal adenopathy    HISTORY OF PRESENT ILLNESS:  69 y/o F, former smoker (0.5 ppd x 7 years, quit 1978) with a PMH of DM, HTN, HLD, knee arthritis, polymyositis on 2.5 mg of prednisone (dx in 2008 by biopsy, followed at [facility]) and back surgery who was admitted on 6/28 from an urgent care for hypotension, weakness, loss of appetite, trouble swallowing/reflux of food.    She was treated with IVF resuscitation with improvement in BP & admitted per [facility] for work up.  Initial CXR was concerning for cavitary lung lesions which prompted a CT of the Chest with contrast which showed no cavitary lesions, GGO/GG nodules upper lobe predominant distrubution, incidental finding of pulmonary embolus in the segmental R LL pulmonary artery, focal consolidation in RLL (? Pulmonary infarct), & mild mediastinal adenopathy.   CBC - WBC 12.0, hgb 11.7, platelets 395, differential with 53% eosinophils.  Sed Rate 70.    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 She grew up in [place].  She has not traveled, has no sick contacts, does not own Orthoptist (specifically birds) and has had no known exposures.    She denies known fevers, chills, nausea, diarrhea, shortness of  breath, cough, sputum production and chest pain.  She has had issues with seasonal allergies in the past few months with clear sinus drainage & watery eyes.  She has noted 2-3 weeks of weakness, decreased appetite, difficulty swallowing solids, dry heaves and reflux of food.  PCCM consulted for evaluation of abnormal CT.   SUBJECTIVE: complains of feeling shaky, explained that this is the steroids and patient said she can tolerate it.  VITAL SIGNS: Temp:  [97.5 F (36.4 C)-97.8 F (36.6 C)] 97.6 F (36.4 C) (07/01 0700) Pulse Rate:  [86-89] 88 (07/01 0716) Resp:  [12-21] 15 (07/01 0716) BP: (97-124)/(55-76) 124/71 mmHg (07/01 0716) SpO2:  [97 %-98 %] 97 % (07/01 0716)  PHYSICAL EXAMINATION: General:  wdwn adult female in NAD, slightly tremulous. Neuro:  AAOx4, speech clear, MAE  HEENT:  MM pink/moist, no JVD Cardiovascular:  s1s2 rrr, no m/r/g  Lungs:  resp's even/non-labored, basilar crackles on R lower posterior, clear on L  Abdomen:  Obese/soft, bsx4 active  Musculoskeletal:  No acute deformities Skin:  Warm/dry, no edema    Recent Labs Lab 07/18/14 1502 07/19/14 0249 07/20/14 0259 07/20/14 0910  NA 130* 132* 131*  --   K 5.0 5.1 5.4* 5.1  CL 102 101 104  --   CO2  --  16* 14*  --   BUN 36* 24* 15  --   CREATININE 1.40* 1.18* 1.00  --   GLUCOSE 159* 118* 206*  --     Recent Labs Lab 07/18/14 1450 07/18/14 1502 07/19/14  0249 07/20/14 0259  HGB 11.9* 13.6 11.7* 11.5*  HCT 35.3* 40.0 35.1* 34.7*  WBC 13.9*  --  12.0* 12.3*  PLT 436*  --  395 363   Dg Esophagus  07/21/2014   CLINICAL DATA:  Loss of appetite, difficulty swallowing. Weaknessfor 6 weeks.difficulty swallowing solids, dry heaves and reflux of food. no difficulty swallowing liquids.  EXAM: ESOPHOGRAM/BARIUM SWALLOW  TECHNIQUE: Combined double contrast and single contrast examination performed using effervescent crystals, thick barium liquid, and thin barium liquid.  FLUOROSCOPY TIME:  Radiation Exposure  Index (as provided by the fluoroscopic device): 35.4 mGy  If the device does not provide the exposure index:  Fluoroscopy Time:  1 minutes and 6 seconds  Number of Acquired Images:  11  COMPARISON:  CT, 07/18/2014.  FINDINGS: Pharyngeal swallowing function is within normal limits.  The esophagus is normal in course and in caliber. No mass or stricture. Transient small hiatal hernia is noted. No reflux was documented during the exam. No evidence of esophagitis.  There is esophageal dysmotility with mild distal tertiary contractions and mild barium stasis.  13 mm barium tablet passes into the stomach without holdup.  IMPRESSION: 1. Esophageal dysmotility, mildly advanced for age. Small transient hiatal hernia. 2. No other abnormality. No mass or stricture. No evidence of esophagitis. No reflux documented.   Electronically Signed   By: Amie Portlandavid  Ormond M.D.   On: 07/21/2014 10:01    ASSESSMENT / PLAN:  69 year old female presenting with SOB.  Extensive work up done with ground glass infiltrate noted subpleurally specially on the AP views of the CT.  Eosinophil % count 53% and IgE is 302.  Patient does not complain of SOB, just of feeling drained.  I reviewed the chest CT myself, evidence of subpleural GGO.  Discussed with PCCM-NP and TRH-MD.  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  Other differential diagnosis would include churg-strauss syndrome and ABPA but are less likely.  - Solumedrol 60 mg IV q6 hours x72 hours.  - F/U imaging after 72 hours to see if resolving.  If resolving then will continue steroids after conversion to PO and can go home with a taper dose and perform allergy testing.  If worsening then will bronchoscope.  - CXR for Sunday ordered.  PNA: I see no evidence of infection.   - Stop abx.  - F/U on cultures.  Elevated eosinophil count: as above.  - Steroids as ordered.  - Will repeat in 3 days CBC with diff.  Hypoxemia: improving.  - Titrate O2 to  off.  - Hopefully can get off O2 all together.  Pulmonary Embolism:  - Coumadin x6 months.  - Heparin now.  Dysphagia: new  - Swallow evaluation.  - Caution with swallowing, chin tuck in.  Alyson ReedyWesam G. Remon Quinto, M.D. [facility] Pulmonary/Critical Care Medicine. Pager: [phone]. After hours pager: [phone].

## 2014-07-21 NOTE — Progress Notes (Signed)
ANTICOAGULATION CONSULT NOTE - Follow Up Consult  Pharmacy Consult for Lovenox Indication: pulmonary embolus  Allergies  Allergen Reactions  . Codeine Anxiety    Patient Measurements: Height: 5\' 6"  (167.6 cm) Weight: 201 lb 15.1 oz (91.6 kg) IBW/kg (Calculated) : 59.3  Vital Signs: Temp: 97.6 F (36.4 C) (07/01 1116) Temp Source: Axillary (07/01 1116) BP: 114/63 mmHg (07/01 1116) Pulse Rate: 97 (07/01 1116)  Labs:  Recent Labs  07/18/14 1450 07/18/14 1502 07/19/14 0249 07/20/14 0259  HGB 11.9* 13.6 11.7* 11.5*  HCT 35.3* 40.0 35.1* 34.7*  PLT 436*  --  395 363  CREATININE  --  1.40* 1.18* 1.00    Estimated Creatinine Clearance: 61.4 mL/min (by C-G formula based on Cr of 1).   Medications:  Scheduled:  . aspirin EC  81 mg Oral Daily  . enoxaparin (LOVENOX) injection  1 mg/kg Subcutaneous Q12H  . feeding supplement (ENSURE ENLIVE)  237 mL Oral BID BM  . fluticasone  1 spray Each Nare QHS  . insulin aspart  0-15 Units Subcutaneous TID WC  . insulin glargine  10 Units Subcutaneous Daily  . methylPREDNISolone (SOLU-MEDROL) injection  60 mg Intravenous Q6H  . pantoprazole  40 mg Oral Daily    Assessment: 69 year old female on anticoagulation with Lovenox for pulmonary emboli.  Her transition to an oral anticoagulant has not yet started due to ongoing workup of pulmonary disease.  No bleeding noted, CBC is stable.  Goal of Therapy:  Anti-Xa level 0.6-1 units/ml 4hrs after LMWH dose given Monitor platelets by anticoagulation protocol: Yes   Plan:  Continue Lovenox 90mg  SQ q12h Monitor CBC at least q72h Monitor renal function Follow for oral anticoagulant plans  Estella HuskMichelle Atticus Lemberger, Pharm.D., BCPS, AAHIVP Clinical Pharmacist Phone: [phone] or [phone] 07/21/2014, 11:39 AM

## 2014-07-21 NOTE — Progress Notes (Signed)
Inpatient Diabetes Program Recommendations  AACE/ADA: New Consensus Statement on Inpatient Glycemic Control (2013)  Target Ranges:  Prepandial:   less than 140 mg/dL      Peak postprandial:   less than 180 mg/dL (1-2 hours)      Critically ill patients:  140 - 180 mg/dL    Results for Beverly Carlson, Beverly Carlson (MRN [id number]) as of 07/21/2014 11:32  Ref. Range 07/20/2014 07:34 07/20/2014 11:38 07/20/2014 17:06 07/20/2014 21:18  Glucose-Capillary Latest Ref Range: 65-99 mg/dL 409234 (H) 811258 (H) 914226 (H) 243 (H)    Results for Beverly Carlson, Beverly Carlson (MRN [id number]) as of 07/21/2014 11:32  Ref. Range 07/21/2014 07:26  Glucose-Capillary Latest Ref Range: 65-99 mg/dL 086386 (H)    Home DM Meds: Lantus 50 units QHS       Novolog 16 units with breakfast and 28 units with supper       Metformin 1000 mg bid  Current DM Orders: Lantus 10 units daily            Novolog Moderate SSI (0-15 units) [facility]ID [facility]    **Patient currently receiving IV Solumedrol 60 mg Q6 hours.  **Having significant glucose elevations.    MD- Please consider the following in-hospital insulin adjustments:  1. Increase Lantus to 30 units daily (~2/3 of home dose of Lantus)  2. Add Novolog Meal Coverage- Novolog 4 units tidwc    Will follow Ambrose FinlandJeannine Johnston Coltrane Tugwell RN, MSN, CDE Diabetes Coordinator Inpatient Glycemic Control Team Team Pager: [phone] (8a-5p)

## 2014-07-22 LAB — COMPREHENSIVE METABOLIC PANEL
ALBUMIN: 2.6 g/dL — AB (ref 3.5–5.0)
ALT: 13 U/L — AB (ref 14–54)
AST: 16 U/L (ref 15–41)
Alkaline Phosphatase: 80 U/L (ref 38–126)
Anion gap: 10 (ref 5–15)
BUN: 22 mg/dL — AB (ref 6–20)
CALCIUM: 8.2 mg/dL — AB (ref 8.9–10.3)
CO2: 21 mmol/L — AB (ref 22–32)
Chloride: 103 mmol/L (ref 101–111)
Creatinine, Ser: 0.9 mg/dL (ref 0.44–1.00)
GFR calc Af Amer: >60 mL/min (ref >60)
GFR calc non Af Amer: >60 mL/min (ref >60)
Glucose, Bld: 316 mg/dL — ABNORMAL HIGH (ref 65–99)
POTASSIUM: 4.8 mmol/L (ref 3.5–5.1)
SODIUM: 134 mmol/L — AB (ref 135–145)
TOTAL PROTEIN: 6.3 g/dL — AB (ref 6.5–8.1)
Total Bilirubin: 0.3 mg/dL (ref 0.3–1.2)

## 2014-07-22 LAB — GLUCOSE, CAPILLARY
GLUCOSE-CAPILLARY: 343 mg/dL — AB (ref 65–99)
Glucose-Capillary: 289 mg/dL — ABNORMAL HIGH (ref 65–99)
Glucose-Capillary: 199 mg/dL — ABNORMAL HIGH (ref 65–99)
Glucose-Capillary: 214 mg/dL — ABNORMAL HIGH (ref 65–99)

## 2014-07-22 LAB — CBC
HCT: 31.7 % — ABNORMAL LOW (ref 36.0–46.0)
Hemoglobin: 10.6 g/dL — ABNORMAL LOW (ref 12.0–15.0)
MCH: 28 pg (ref 26.0–34.0)
MCHC: 33.4 g/dL (ref 30.0–36.0)
MCV: 83.9 fL (ref 78.0–100.0)
Platelets: 308 10*3/uL (ref 150–400)
RBC: 3.78 MIL/uL — ABNORMAL LOW (ref 3.87–5.11)
RDW: 13.9 % (ref 11.5–15.5)
WBC: 12.8 10*3/uL — ABNORMAL HIGH (ref 4.0–10.5)

## 2014-07-22 MED ORDER — COLESEVELAM HCL 625 MG PO TABS
1875.0000 mg | ORAL_TABLET | Freq: Two times a day (BID) | ORAL | Status: DC
  Administered 2014-07-22 – 2014-07-24 (×4): 1875 mg via ORAL
  Filled 2014-07-22 (×6): qty 3

## 2014-07-22 MED ORDER — RIVAROXABAN 20 MG PO TABS: 20.0000 mg | ORAL_TABLET | Freq: Every day | ORAL | Status: DC

## 2014-07-22 MED ORDER — INSULIN GLARGINE 100 UNIT/ML ~~LOC~~ SOLN
50.0000 [IU] | Freq: Every day | SUBCUTANEOUS | Status: DC
  Administered 2014-07-23: 50 [IU] via SUBCUTANEOUS
  Filled 2014-07-22: qty 0.5

## 2014-07-22 MED ORDER — METHYLPREDNISOLONE SODIUM SUCC 40 MG IJ SOLR
40.0000 mg | Freq: Two times a day (BID) | INTRAMUSCULAR | Status: AC
  Administered 2014-07-22 – 2014-07-23 (×3): 40 mg via INTRAVENOUS
  Filled 2014-07-22 (×4): qty 1

## 2014-07-22 MED ORDER — RIVAROXABAN 15 MG PO TABS
15.0000 mg | ORAL_TABLET | Freq: Two times a day (BID) | ORAL | Status: DC
  Administered 2014-07-23 – 2014-07-24 (×4): 15 mg via ORAL
  Filled 2014-07-22 (×5): qty 1

## 2014-07-22 MED ORDER — PANTOPRAZOLE SODIUM 40 MG PO TBEC
40.0000 mg | DELAYED_RELEASE_TABLET | Freq: Two times a day (BID) | ORAL | Status: DC
  Administered 2014-07-22 – 2014-07-24 (×4): 40 mg via ORAL
  Filled 2014-07-22 (×4): qty 1

## 2014-07-22 MED ORDER — TRAMADOL HCL 50 MG PO TABS
50.0000 mg | ORAL_TABLET | Freq: Once | ORAL | Status: AC
  Administered 2014-07-22: 50 mg via ORAL
  Filled 2014-07-22: qty 1

## 2014-07-22 NOTE — Progress Notes (Signed)
ANTICOAGULATION CONSULT NOTE - Follow Up Consult  Pharmacy Consult for xarelto Indication: pulmonary embolus  Allergies  Allergen Reactions  . Codeine Anxiety    Patient Measurements: Height: 5\' 6"  (167.6 cm) Weight: 201 lb 15.1 oz (91.6 kg) IBW/kg (Calculated) : 59.3  Vital Signs: Temp: 97.3 F (36.3 C) (07/02 0758) Temp Source: Oral (07/02 0758) BP: 137/76 mmHg (07/02 0758)  Labs:  Recent Labs  07/20/14 0259 07/22/14 0337  HGB 11.5* 10.6*  HCT 34.7* 31.7*  PLT 363 308  CREATININE 1.00 0.90    Estimated Creatinine Clearance: 68.2 mL/min (by C-G formula based on Cr of 0.9).   Medications:  Scheduled:  . aspirin EC  81 mg Oral Daily  . colesevelam  1,875 mg Oral BID WC  . enoxaparin (LOVENOX) injection  1 mg/kg Subcutaneous Q12H  . feeding supplement (ENSURE ENLIVE)  237 mL Oral BID BM  . fluticasone  1 spray Each Nare QHS  . insulin aspart  0-20 Units Subcutaneous TID WC  . insulin aspart  0-5 Units Subcutaneous QHS  . [START ON 07/23/2014] insulin glargine  50 Units Subcutaneous Daily  . methylPREDNISolone (SOLU-MEDROL) injection  40 mg Intravenous Q12H  . pantoprazole  40 mg Oral BID AC    Assessment: 69 year old female on anticoagulation with Lovenox for pulmonary emboli. Plan is to transition to Xarelto today since bx or bronh is less likely at this point. She got the lovenox dose this PM so will start xarelto in AM.  Goal of Therapy:   Monitor platelets by anticoagulation protocol: Yes   Plan:   Dc Lovenox Xarelto 15mg  PO BID x 21 days then 20mg  PO qday Monitor for bleeding  Ulyses SouthwardMinh Lucky Trotta, PharmD Pager: [phone] 07/22/2014 6:03 PM

## 2014-07-22 NOTE — Progress Notes (Signed)
Name: Beverly Carlson MRN: [id number] DOB: 08-01-1945    ADMISSION DATE:  07/18/2014 CONSULTATION DATE:  07/20/14  REFERRING MD :  Dr. Joseph ArtWoods   CHIEF COMPLAINT:  Abnormal CT Chest  BRIEF PATIENT DESCRIPTION: 69 y/o F admitted 6/28 for hypotension, weakness, loss of appetite and difficulty swallowing on ACEi which was stopped on admit and PCCM service asked to eval for abn CT chest.    SIGNIFICANT EVENTS  6/28  Admit     STUDIES:  6/28  CT of Chest >> no cavitary lesions, GGO/GG nodules upper lobe predominant distrubution, incidental finding of pulmonary embolus in the segmental R LL pulmonary artery, focal consolidation in RLL (? Pulmonary infarct), mild mediastinal adenopathy  07/21/14  Esophagram  1. Esophageal dysmotility, mildly advanced for age. Small transient hiatal hernia. 2. No other abnormality. No mass or stricture. No evidence of esophagitis. No reflux documented.       SUBJECTIVE:  Feeling better every day, sense of choking when swallowing some better off acei since admit    VITAL SIGNS: Temp:  [97.3 F (36.3 C)-98.5 F (36.9 C)] 97.3 F (36.3 C) (07/02 0758) Pulse Rate:  [67-97] 67 (07/02 0412) Resp:  [12-20] 17 (07/02 0758) BP: (96-137)/(55-76) 137/76 mmHg (07/02 0758) SpO2:  [94 %-98 %] 98 % (07/02 0758) FIO2 = RA   PHYSICAL EXAMINATION: General:  wdwn adult female in NAD, classic severe voice fatigue Neuro:  AAOx4, speech clear, MAE  HEENT:  MM pink/moist, no JVD Cardiovascular:  s1s2 rrr, no m/r/g  Lungs:  resp's even/non-labored, minimal rhonchi  Abdomen:  Obese/soft, bsx4 active  Musculoskeletal:  No acute deformities Skin:  Warm/dry, no edema    Recent Labs Lab 07/19/14 0249 07/20/14 0259 07/20/14 0910 07/22/14 0337  NA 132* 131*  --  134*  K 5.1 5.4* 5.1 4.8  CL 101 104  --  103  CO2 16* 14*  --  21*  BUN 24* 15  --  22*  CREATININE 1.18* 1.00  --  0.90  GLUCOSE 118* 206*  --  316*    Recent Labs Lab 07/19/14 0249 07/20/14 0259  07/22/14 0337  HGB 11.7* 11.5* 10.6*  HCT 35.1* 34.7* 31.7*  WBC 12.0* 12.3* 12.8*  PLT 395 363 308   Dg Esophagus  07/21/2014   CLINICAL DATA:  Loss of appetite, difficulty swallowing. Weaknessfor 6 weeks.difficulty swallowing solids, dry heaves and reflux of food. no difficulty swallowing liquids.  EXAM: ESOPHOGRAM/BARIUM SWALLOW  TECHNIQUE: Combined double contrast and single contrast examination performed using effervescent crystals, thick barium liquid, and thin barium liquid.  FLUOROSCOPY TIME:  Radiation Exposure Index (as provided by the fluoroscopic device): 35.4 mGy  If the device does not provide the exposure index:  Fluoroscopy Time:  1 minutes and 6 seconds  Number of Acquired Images:  11  COMPARISON:  CT, 07/18/2014.  FINDINGS: Pharyngeal swallowing function is within normal limits.  The esophagus is normal in course and in caliber. No mass or stricture. Transient small hiatal hernia is noted. No reflux was documented during the exam. No evidence of esophagitis.  There is esophageal dysmotility with mild distal tertiary contractions and mild barium stasis.  13 mm barium tablet passes into the stomach without holdup.  IMPRESSION: 1. Esophageal dysmotility, mildly advanced for age. Small transient hiatal hernia. 2. No other abnormality. No mass or stricture. No evidence of esophagitis. No reflux documented.   Electronically Signed   By: Amie Portlandavid  Ormond M.D.   On: 07/21/2014 10:01    ASSESSMENT /  PLAN:    1) pulmonary infiltrates with eosinophilia new since last diff 11/29/13 and classic "photo negative" pulmonary edema" pattern  on admit cxr  C/w eosinophilic pna  - on steroids since 07/20/14  And improving - probably a medication reaction so rec keep rx simple as possible - The goal with a chronic steroid dependent illness is always arriving at the lowest effective dose that controls the disease/symptoms and not accepting a set "formula" which is based on statistics or guidelines that don't  always take into account patient  variability or the natural hx of the dz in every individual patient, which may well vary over time.  For now therefore I recommend the patient tper off steroids x 2 weeks then see in office to group   2) dysphagia and voice fatigue are classic equivalent of the ACEi cough in my experience and only way to prove it wrong is 6 weeks trial off   3) best case scenario is the eos and the airway symptoms are all acei related, though I've not seen this before - time will tell.   4) HBP doing fine off ACEi   Pulmonary f/u as inpt is prn .   Sandrea Hughs, MD Pulmonary and Critical Care Medicine [facility] Cell [phone] After 5:30 PM or weekends, call [phone]

## 2014-07-22 NOTE — Progress Notes (Signed)
[place] TEAM 1 - Stepdown/ICU TEAM Progress Note  Beverly Carlson ZOX:[id number] DOB: [birth year] DOA: 07/18/2014 PCP: Janace Hoard, MD  Admit HPI / Brief Narrative: 69 year old female with history of hypertension, diabetes, arthritis, and urinary incontinence who presented to the ED stating she'd been feeling generally weak for approximately 6 weeks with a decreased appetite with intermittent episodes where she has trouble swallowing solid foods but no difficulty swallowing liquids. Her weakness/fatingue progressed to the point she had to sit down 3x while simply walking out to her car from church. No dizziness, syncope, or cp. She went for follow-up appt at her PCP office and was sent to the ED due to hypotension.   ED course: cxr with questionable cavitary lung lesions which prompted a CT chest which showed bilateral patchy ground glass opacities and incidental PE.  Patient given ivf/rocephin/zitrho.  HPI/Subjective: Pt is resting comfortably.  She continues to feel better each day.  She denies current cp, n/v, or abdom pain.  She states her sob is much improved.    Assessment/Plan:  Pulmonary emboli  Continue full dose anticoagulation - no clear inciting etiology - now that bx or bronch much less likely, will transition to oral med  Multifocal regions of ground glass pulmonary opacity as well as ground glass nodules bilateral lungs with mild mediastinal adenopathy Radiographic findings and history are in fact suggestive of idiopathic acute eosinophilic pneumonia - ANCA negative - Pulmonary is following - steroids increased to higher IV dosing to complete 72 hours of tx w/ plan to taper over a 2 week period thereafter - for f/u CXR in AM   Hyponatremia / Dehydration / Hypotension Resolved with simple hydration - a.m. cortisol within normal range (obtained prior to prednisone initiation)   Mild metabolic acidosis Resolved  Solid food dysphagia Esophagram notes mildly advanced  esophageal dysmotility for age with small transient hiatal hernia but no other abnormalities - resume diet and follow symptoms - possibly related to her use of ACE per Dr. Sherene Sires - ACE has been stopped   DM2 CBGs very poorly controlled with addition of high-dose steroids - adjust treatment plan again today and follow  Hx HTN Blood pressure currently well controlled  Equivocal UA Findings most likely due to dehydration - remains asymptomatic   Obesity - Body mass index is 32.61 kg/(m^2).   HLD LDL 198? - LFTs normal - initiation tx and plan for recheck in 8-12 weeks   Code Status: FULL Family Communication: no family present at time of exam Disposition Plan: stable for transfer to medical bed - PT/OT - possible d/c home in next 24-48hrs  Consultants: Pulmonary  Procedures: 6/29 bilateral lower extremity venous duplex - negative DVT  Antibiotics: Azithromycin 6/28  Ceftriaxone 6/28  Fluconazole 6/28 (yeast infection)  DVT prophylaxis: Full dose lovenox   Objective: Blood pressure 137/76, pulse 67, temperature 97.3 F (36.3 C), temperature source Oral, resp. rate 17, height 5\' 6"  (1.676 m), weight 91.6 kg (201 lb 15.1 oz), SpO2 98 %.  Intake/Output Summary (Last 24 hours) at 07/22/14 1618 Last data filed at 07/22/14 0700  Gross per 24 hour  Intake     60 ml  Output      0 ml  Net     60 ml   Exam: General: No acute respiratory distress - alert / conversant   Lungs: CTA th/o w/o wheeze or crackles  Cardiovascular: Regular rate and rhythm without murmur gallop rub  Abdomen: Nontender, nondistended, soft, bowel sounds positive, no rebound, no  ascites, no appreciable mass Extremities: No significant cyanosis, clubbing, or edema bilateral lower extremities  Data Reviewed: Basic Metabolic Panel:  Recent Labs Lab 07/18/14 1502 07/19/14 0249 07/20/14 0259 07/20/14 0910 07/22/14 0337  NA 130* 132* 131*  --  134*  K 5.0 5.1 5.4* 5.1 4.8  CL 102 101 104  --  103  CO2   --  16* 14*  --  21*  GLUCOSE 159* 118* 206*  --  316*  BUN 36* 24* 15  --  22*  CREATININE 1.40* 1.18* 1.00  --  0.90  CALCIUM  --  8.5* 8.1*  --  8.2*    CBC:  Recent Labs Lab 07/18/14 1251 07/18/14 1450 07/18/14 1502 07/19/14 0249 07/20/14 0259 07/22/14 0337  WBC 15.6* 13.9*  --  12.0* 12.3* 12.8*  NEUTROABS  --  5.1  --  4.2  --   --   HGB 12.4 11.9* 13.6 11.7* 11.5* 10.6*  HCT 38.9 35.3* 40.0 35.1* 34.7* 31.7*  MCV 85.6 85.1  --  85.8 84.6 83.9  PLT  --  436*  --  395 363 308   Liver Function Tests:  Recent Labs Lab 07/20/14 0259 07/22/14 0337  AST 16 16  ALT 10* 13*  ALKPHOS 88 80  BILITOT 0.9 0.3  PROT 6.0* 6.3*  ALBUMIN 2.5* 2.6*   CBG:  Recent Labs Lab 07/21/14 1109 07/21/14 1641 07/21/14 2127 07/22/14 0756 07/22/14 1140  GLUCAP 348* 347* 308* 343* 289*    Recent Results (from the past 240 hour(s))  Culture, blood (routine x 2)     Status: None (Preliminary result)   Collection Time: 07/18/14  6:42 PM  Result Value Ref Range Status   Specimen Description BLOOD RIGHT HAND  Final   Special Requests BOTTLES DRAWN AEROBIC ONLY 2CC  Final   Culture NO GROWTH 4 DAYS  Final   Report Status PENDING  Incomplete  MRSA PCR Screening     Status: None   Collection Time: 07/18/14  8:00 PM  Result Value Ref Range Status   MRSA by PCR NEGATIVE NEGATIVE Final    Comment:        The GeneXpert MRSA Assay (FDA approved for NASAL specimens only), is one component of a comprehensive MRSA colonization surveillance program. It is not intended to diagnose MRSA infection nor to guide or monitor treatment for MRSA infections.   Culture, blood (routine x 2)     Status: None (Preliminary result)   Collection Time: 07/18/14  8:30 PM  Result Value Ref Range Status   Specimen Description BLOOD LEFT HAND  Final   Special Requests IN PEDIATRIC BOTTLE 4CC  Final   Culture NO GROWTH 3 DAYS  Final   Report Status PENDING  Incomplete     Studies:   Recent x-ray  studies have been reviewed in detail by the Attending Physician  Scheduled Meds:  Scheduled Meds: . aspirin EC  81 mg Oral Daily  . enoxaparin (LOVENOX) injection  1 mg/kg Subcutaneous Q12H  . feeding supplement (ENSURE ENLIVE)  237 mL Oral BID BM  . fluticasone  1 spray Each Nare QHS  . insulin aspart  0-20 Units Subcutaneous TID WC  . insulin aspart  0-5 Units Subcutaneous QHS  . insulin glargine  32 Units Subcutaneous Daily  . methylPREDNISolone (SOLU-MEDROL) injection  40 mg Intravenous Q12H  . pantoprazole  40 mg Oral BID AC    Time spent on care of this patient: 35 mins   MCCLUNG,JEFFREY T ,  MD   [facility] Office  [phone] Pager - Text Page per Loretha Stapler as per below:  On-Call/Text Page:      [email or web address]      password [email or web address]  If 7PM-7AM, please contact night-coverage [email or web address] Password [email or web address] 07/22/2014, 4:18 PM   LOS: 4 days

## 2014-07-23 ENCOUNTER — Inpatient Hospital Stay (HOSPITAL_COMMUNITY): Payer: Medicare Other

## 2014-07-23 DIAGNOSIS — I5032 Chronic diastolic (congestive) heart failure: Secondary | ICD-10-CM

## 2014-07-23 DIAGNOSIS — E1165 Type 2 diabetes mellitus with hyperglycemia: Secondary | ICD-10-CM

## 2014-07-23 DIAGNOSIS — R1310 Dysphagia, unspecified: Secondary | ICD-10-CM

## 2014-07-23 LAB — CULTURE, BLOOD (ROUTINE X 2): Culture: NO GROWTH

## 2014-07-23 LAB — CBC
HCT: 32 % — ABNORMAL LOW (ref 36.0–46.0)
Hemoglobin: 10.7 g/dL — ABNORMAL LOW (ref 12.0–15.0)
MCH: 27.9 pg (ref 26.0–34.0)
MCHC: 33.4 g/dL (ref 30.0–36.0)
MCV: 83.6 fL (ref 78.0–100.0)
PLATELETS: 303 10*3/uL (ref 150–400)
RBC: 3.83 MIL/uL — ABNORMAL LOW (ref 3.87–5.11)
RDW: 14 % (ref 11.5–15.5)
WBC: 12 10*3/uL — AB (ref 4.0–10.5)

## 2014-07-23 LAB — GLUCOSE, CAPILLARY
GLUCOSE-CAPILLARY: 140 mg/dL — AB (ref 65–99)
Glucose-Capillary: 243 mg/dL — ABNORMAL HIGH (ref 65–99)
Glucose-Capillary: 335 mg/dL — ABNORMAL HIGH (ref 65–99)
Glucose-Capillary: 130 mg/dL — ABNORMAL HIGH (ref 65–99)

## 2014-07-23 MED ORDER — PREDNISONE 20 MG PO TABS
20.0000 mg | ORAL_TABLET | Freq: Two times a day (BID) | ORAL | Status: DC
  Administered 2014-07-24 (×2): 20 mg via ORAL
  Filled 2014-07-23 (×2): qty 1

## 2014-07-23 MED ORDER — INSULIN GLARGINE 100 UNIT/ML ~~LOC~~ SOLN
60.0000 [IU] | Freq: Every day | SUBCUTANEOUS | Status: DC
  Administered 2014-07-24: 60 [IU] via SUBCUTANEOUS
  Filled 2014-07-23: qty 0.6

## 2014-07-23 MED ORDER — INSULIN ASPART 100 UNIT/ML ~~LOC~~ SOLN
8.0000 [IU] | Freq: Three times a day (TID) | SUBCUTANEOUS | Status: DC
  Administered 2014-07-23: 8 [IU] via SUBCUTANEOUS

## 2014-07-23 NOTE — Progress Notes (Signed)
07/22/2014 9:00PM  Patient arrived from [address] via wheelchair with RN. Patient alert and oriented x4. Patient has been oriented to the room, unit and staff. Patient denies any pain. Patient IV clean, dry and intact and infusing. Skin assessment completed and no issues noted. Safety Fall Prevention Plan was given, discussed and signed. Orders have been reviewed and implemented. Will continue to monitor the patient. Call light has been placed within reach.  Rivka BarbaraZenab Edita Weyenberg BSN, RN  Phone Number: [phone]

## 2014-07-23 NOTE — Progress Notes (Signed)
[place] TEAM 1 - Stepdown/ICU TEAM Progress Note  Beverly Carlson ZOX:[id number] DOB: 1945/02/01 DOA: 07/18/2014 PCP: Janace Hoard, MD  Admit HPI / Brief Narrative: 69 year old female with history of hypertension, diabetes, arthritis, and urinary incontinence who presented to the ED stating she'd been feeling generally weak for approximately 6 weeks with a decreased appetite with intermittent episodes where she has trouble swallowing solid foods but no difficulty swallowing liquids. Her weakness/fatingue progressed to the point she had to sit down 3x while simply walking out to her car from church. No dizziness, syncope, or cp. She went for follow-up appt at her PCP office and was sent to the ED due to hypotension.   ED course: cxr with questionable cavitary lung lesions which prompted a CT chest which showed bilateral patchy ground glass opacities and incidental PE.  Patient given ivf/rocephin/zitrho.  HPI/Subjective: The pt states she is feeling better in general today.  She denies cp, n/v, or abdominal pain at this time.  She also denies sob at present.    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 cortisol within normal range (obtained prior to prednisone initiation)   Mild metabolic acidosis Resolved  Solid food  dysphagia Esophagram notes mildly advanced esophageal dysmotility for age with small transient hiatal hernia but no other abnormalities - resume diet and follow symptoms - possibly related to her use of ACE per Dr. Sherene Sires - ACE has been stopped - sx already improving per her report   DM2 CBGs very poorly controlled with addition of high-dose steroids - adjust treatment plan again today and follow - beginning steroid taper   Hx HTN Blood pressure currently well controlled  Equivocal UA Findings most likely due to dehydration - remains asymptomatic   Obesity - Body mass index is 33.96 kg/(m^2).   HLD LDL 198? - LFTs normal - pt states she had significant myopathy w/ Lipitor - cont welchol for now   Code Status: FULL Family Communication: no family present at time of exam Disposition Plan: PT/OT - probable d/c home 7/4  Consultants: Pulmonary  Procedures: 6/29 bilateral lower extremity venous duplex - negative DVT  Antibiotics: Azithromycin 6/28  Ceftriaxone 6/28  Fluconazole 6/28 (yeast infection)  DVT prophylaxis: Xarelto   Objective: Blood pressure 120/55, pulse 68, temperature 97.8 F (36.6 C), temperature source Oral, resp. rate 17, height 5\' 6"  (1.676 m), weight 95.4 kg (210 lb 5.1 oz), SpO2 97 %.  Intake/Output Summary (Last 24 hours) at 07/23/14 1454 Last data filed at 07/23/14 1350  Gross per 24 hour  Intake 1271.67 ml  Output   1250 ml  Net  21.67 ml   Exam: General: No acute respiratory distress - alert and conversant   Lungs: Clear to auscultation th/o all fields w/o wheeze or crackles  Cardiovascular: Regular rate and rhythm without murmur gallop rub  Abdomen: Nontender, nondistended,  soft, bowel sounds positive, no rebound, no ascites, no appreciable mass Extremities: No significant cyanosis, clubbing, edema bilateral lower extremities  Data Reviewed: Basic Metabolic Panel:  Recent Labs Lab 07/18/14 1502 07/19/14 0249 07/20/14 0259 07/20/14 0910  07/22/14 0337  NA 130* 132* 131*  --  134*  K 5.0 5.1 5.4* 5.1 4.8  CL 102 101 104  --  103  CO2  --  16* 14*  --  21*  GLUCOSE 159* 118* 206*  --  316*  BUN 36* 24* 15  --  22*  CREATININE 1.40* 1.18* 1.00  --  0.90  CALCIUM  --  8.5* 8.1*  --  8.2*    CBC:  Recent Labs Lab 07/18/14 1450 07/18/14 1502 07/19/14 0249 07/20/14 0259 07/22/14 0337 07/23/14 0555  WBC 13.9*  --  12.0* 12.3* 12.8* 12.0*  NEUTROABS 5.1  --  4.2  --   --   --   HGB 11.9* 13.6 11.7* 11.5* 10.6* 10.7*  HCT 35.3* 40.0 35.1* 34.7* 31.7* 32.0*  MCV 85.1  --  85.8 84.6 83.9 83.6  PLT 436*  --  395 363 308 303   Liver Function Tests:  Recent Labs Lab 07/20/14 0259 07/22/14 0337  AST 16 16  ALT 10* 13*  ALKPHOS 88 80  BILITOT 0.9 0.3  PROT 6.0* 6.3*  ALBUMIN 2.5* 2.6*   CBG:  Recent Labs Lab 07/22/14 1140 07/22/14 1757 07/22/14 2118 07/23/14 0734 07/23/14 1125  GLUCAP 289* 199* 214* 243* 335*    Recent Results (from the past 240 hour(s))  Culture, blood (routine x 2)     Status: None   Collection Time: 07/18/14  6:42 PM  Result Value Ref Range Status   Specimen Description BLOOD RIGHT HAND  Final   Special Requests BOTTLES DRAWN AEROBIC ONLY 2CC  Final   Culture NO GROWTH 5 DAYS  Final   Report Status 07/23/2014 FINAL  Final  MRSA PCR Screening     Status: None   Collection Time: 07/18/14  8:00 PM  Result Value Ref Range Status   MRSA by PCR NEGATIVE NEGATIVE Final    Comment:        The GeneXpert MRSA Assay (FDA approved for NASAL specimens only), is one component of a comprehensive MRSA colonization surveillance program. It is not intended to diagnose MRSA infection nor to guide or monitor treatment for MRSA infections.   Culture, blood (routine x 2)     Status: None (Preliminary result)   Collection Time: 07/18/14  8:30 PM  Result Value Ref Range Status   Specimen Description BLOOD LEFT HAND  Final   Special Requests IN PEDIATRIC BOTTLE 4CC  Final   Culture NO  GROWTH 4 DAYS  Final   Report Status PENDING  Incomplete     Studies:   Recent x-ray studies have been reviewed in detail by the Attending Physician  Scheduled Meds:  Scheduled Meds: . aspirin EC  81 mg Oral Daily  . colesevelam  1,875 mg Oral BID WC  . feeding supplement (ENSURE ENLIVE)  237 mL Oral BID BM  . fluticasone  1 spray Each Nare QHS  . insulin aspart  0-20 Units Subcutaneous TID WC  . insulin aspart  0-5 Units Subcutaneous QHS  . insulin aspart  8 Units Subcutaneous TID WC  . [START ON 07/24/2014] insulin glargine  60 Units Subcutaneous Daily  . methylPREDNISolone (SOLU-MEDROL) injection  40 mg Intravenous Q12H  . pantoprazole  40 mg Oral BID AC  . [  START ON 07/24/2014] predniSONE  20 mg Oral BID WC  . rivaroxaban  15 mg Oral BID   Followed by  . [START ON 08/13/2014] rivaroxaban  20 mg Oral Q supper    Time spent on care of this patient: 35 mins   Kyndell Zeiser T , MD   [facility] Office  [phone] Pager - Text Page per Loretha Stapler as per below:  On-Call/Text Page:      [email or web address]      password [email or web address]  If 7PM-7AM, please contact night-coverage [email or web address] Password [email or web address] 07/23/2014, 2:54 PM   LOS: 5 days

## 2014-07-23 NOTE — Discharge Instructions (Addendum)
Information on my medicine - XARELTO (rivaroxaban)  This medication education was reviewed with me or my healthcare representative as part of my discharge preparation.  The pharmacist that spoke with me during my hospital stay was:  Pasty Spillers, [facility]  WHY WAS Beverly Carlson PRESCRIBED FOR YOU? Xarelto was prescribed to treat blood clots that may have been found in the veins of your legs (deep vein thrombosis) or in your lungs (pulmonary embolism) and to reduce the risk of them occurring again.  What do you need to know about Xarelto? The starting dose is one 15 mg tablet taken TWICE daily with food for the FIRST 21 DAYS then on (enter date)  08/13/14  the dose is changed to one 20 mg tablet taken ONCE A DAY with your evening meal.  DO NOT stop taking Xarelto without talking to the health care provider who prescribed the medication.  Refill your prescription for 20 mg tablets before you run out.  After discharge, you should have regular check-up appointments with your healthcare provider that is prescribing your Xarelto.  In the future your dose may need to be changed if your kidney function changes by a significant amount.  What do you do if you miss a dose? If you are taking Xarelto TWICE DAILY and you miss a dose, take it as soon as you remember. You may take two 15 mg tablets (total 30 mg) at the same time then resume your regularly scheduled 15 mg twice daily the next day.  If you are taking Xarelto ONCE DAILY and you miss a dose, take it as soon as you remember on the same day then continue your regularly scheduled once daily regimen the next day. Do not take two doses of Xarelto at the same time.   Important Safety Information Xarelto is a blood thinner medicine that can cause bleeding. You should call your healthcare provider right away if you experience any of the following: ? Bleeding from an injury or your nose that does not stop. ? Unusual colored urine (red or  dark brown) or unusual colored stools (red or black). ? Unusual bruising for unknown reasons. ? A serious fall or if you hit your head (even if there is no bleeding).  Some medicines may interact with Xarelto and might increase your risk of bleeding while on Xarelto. To help avoid this, consult your healthcare provider or pharmacist prior to using any new prescription or non-prescription medications, including herbals, vitamins, non-steroidal anti-inflammatory drugs (NSAIDs) and supplements.  This website has more information on Xarelto: VisitDestination.com.br.  Pulmonary Embolism A pulmonary (lung) embolism (PE) is a blood clot that has traveled to the lung and results in a blockage of blood flow in the affected lung. Most clots come from deep veins in the legs or pelvis. PE is a dangerous and potentially life-threatening condition that can be treated if identified. CAUSES Blood clots form in a vein for different reasons. Usually several things cause blood clots. They include:  The flow of blood slows down.  The inside of the vein is damaged in some way.  The person has a condition that makes the blood clot more easily. RISK FACTORS Some people are more likely than others to develop PE. Risk factors include:   Smoking.  Being overweight (obese).  Sitting or lying still for a long time. This includes long-distance travel, paralysis, or recovery from an illness or surgery. Other factors that increase risk are:   Older age, especially over 75 years of  age.  Having a family history of blood clots or if you have already had a blood clot.  Having major or lengthy surgery. This is especially true for surgery on the hip, knee, or belly (abdomen). Hip surgery is particularly high risk.  Having a long, thin tube (catheter) placed inside a vein during a medical procedure.  Breaking a hip or leg.  Having cancer or cancer treatment.  Medicines containing the female hormone estrogen. This  includes birth control pills and hormone replacement therapy.  Other circulation or heart problems.  Pregnancy and childbirth.  Hormone changes make the blood clot more easily during pregnancy.  The fetus puts pressure on the veins of the pelvis.  There is a risk of injury to veins during delivery or a caesarean delivery. The risk is highest just after childbirth.  PREVENTION   Exercise the legs regularly. Take a brisk 30 minute walk every day.  Maintain a weight that is appropriate for your height.  Avoid sitting or lying in bed for long periods of time without moving your legs.  Women, particularly those over the age of 35 years, should consider the risks and benefits of taking estrogen medicines, including birth control pills.  Do not smoke, especially if you take estrogen medicines.  Long-distance travel can increase your risk. You should exercise your legs by walking or pumping the muscles every hour.  Many of the risk factors above relate to situations that exist with hospitalization, either for illness, injury, or elective surgery. Prevention may include medical and nonmedical measures.   Your health care provider will assess you for the need for venous thromboembolism prevention when you are admitted to the hospital. If you are having surgery, your surgeon will assess you the day of or day after surgery.  SYMPTOMS  The symptoms of a PE usually start suddenly and include:  Shortness of breath.  Coughing.  Coughing up blood or blood-tinged mucus.  Chest pain. Pain is often worse with deep breaths.  Rapid heartbeat. DIAGNOSIS  If a PE is suspected, your health care provider will take a medical history and perform a physical exam. Other tests that may be required include:  Blood tests, such as studies of the clotting properties of your blood.  Imaging tests, such as ultrasound, CT, MRI, and other tests to see if you have clots in your legs or lungs.  An  electrocardiogram. This can look for heart strain from blood clots in the lungs. TREATMENT   The most common treatment for a PE is blood thinning (anticoagulant) medicine, which reduces the blood's tendency to clot. Anticoagulants can stop new blood clots from forming and old clots from growing. They cannot dissolve existing clots. Your body does this by itself over time. Anticoagulants can be given by mouth, through an intravenous (IV) tube, or by injection. Your health care provider will determine the best program for you.  Less commonly, clot-dissolving medicines (thrombolytics) are used to dissolve a PE. They carry a high risk of bleeding, so they are used mainly in severe cases.  Very rarely, a blood clot in the leg needs to be removed surgically.  If you are unable to take anticoagulants, your health care provider may arrange for you to have a filter placed in a main vein in your abdomen. This filter prevents clots from traveling to your lungs. HOME CARE INSTRUCTIONS   Take all medicines as directed by your health care provider.  Learn as much as you can about DVT.  Wear  a medical alert bracelet or carry a medical alert card.  Ask your health care provider how soon you can go back to normal activities. It is important to stay active to prevent blood clots. If you are on anticoagulant medicine, avoid contact sports.  It is very important to exercise. This is especially important while traveling, sitting, or standing for long periods of time. Exercise your legs by walking or by tightening and relaxing your leg muscles regularly. Take frequent walks.  You may need to wear compression stockings. These are tight elastic stockings that apply pressure to the lower legs. This pressure can help keep the blood in the legs from clotting. Taking Warfarin Warfarin is a daily medicine that is taken by mouth. Your health care provider will advise you on the length of treatment (usually 3-6 months,  sometimes lifelong). If you take warfarin:  Understand how to take warfarin and foods that can affect how warfarin works in Public relations account executive.  Too much and too little warfarin are both dangerous. Too much warfarin increases the risk of bleeding. Too little warfarin continues to allow the risk for blood clots. Warfarin and Regular Blood Testing While taking warfarin, you will need to have regular blood tests to measure your blood clotting time. These blood tests usually include both the prothrombin time (PT) and international normalized ratio (INR) tests. The PT and INR results allow your health care provider to adjust your dose of warfarin. It is very important that you have your PT and INR tested as often as directed by your health care provider.  Warfarin and Your Diet Avoid major changes in your diet, or notify your health care provider before changing your diet. Arrange a visit with a registered dietitian to answer your questions. Many foods, especially foods high in vitamin K, can interfere with warfarin and affect the PT and INR results. You should eat a consistent amount of foods high in vitamin K. Foods high in vitamin K include:   Spinach, kale, broccoli, cabbage, collard and turnip greens, Brussels sprouts, peas, cauliflower, seaweed, and parsley.  Beef and pork liver.  Green tea.  Soybean oil. Warfarin with Other Medicines Many medicines can interfere with warfarin and affect the PT and INR results. You must:  Tell your health care provider about any and all medicines, vitamins, and supplements you take, including aspirin and other over-the-counter anti-inflammatory medicines. Be especially cautious with aspirin and anti-inflammatory medicines. Ask your health care provider before taking these.  Do not take or discontinue any prescribed or over-the-counter medicine except on the advice of your health care provider or pharmacist. Warfarin Side Effects Warfarin can have side effects, such  as easy bruising and difficulty stopping bleeding. Ask your health care provider or pharmacist about other side effects of warfarin. You will need to:  Hold pressure over cuts for longer than usual.  Notify your dentist and other health care providers that you are taking warfarin before you undergo any procedures where bleeding may occur. Warfarin with Alcohol and Tobacco   Drinking alcohol frequently can increase the effect of warfarin, leading to excess bleeding. It is best to avoid alcoholic drinks or consume only very small amounts while taking warfarin. Notify your health care provider if you change your alcohol intake.  Do not use any tobacco products including cigarettes, chewing tobacco, or electronic cigarettes. If you smoke, quit. Ask your health care provider for help with quitting smoking. Alternative Medicines to Warfarin: Factor Xa Inhibitor Medicines  These blood thinning medicines  are taken by mouth, usually for several weeks or longer. It is important to take the medicine every single day, at the same time each day.  There are no regular blood tests required when using these medicines.  There are fewer food and drug interactions than with warfarin.  The side effects of this class of medicine is similar to that of warfarin, including excessive bruising or bleeding. Ask your health care provider or pharmacist about other potential side effects. SEEK MEDICAL CARE IF:   You notice a rapid heartbeat.  You feel weaker or more tired than usual.  You feel faint.  You notice increased bruising.  Your symptoms are not getting better in the time expected.  You are having side effects of medicine. SEEK IMMEDIATE MEDICAL CARE IF:   You have chest pain.  You have trouble breathing.  You have new or increased swelling or pain in one leg.  You cough up blood.  You notice blood in vomit, in a bowel movement, or in urine.  You have a fever. Symptoms of PE may represent a  serious problem that is an emergency. Do not wait to see if the symptoms will go away. Get medical help right away. Call your local emergency services (911 in the [place]). Do not drive yourself to the hospital. Document Released: 01/04/2000 Document Revised: 05/23/2013 Document Reviewed: 01/17/2013 [facility] Patient Information 2015 [place], [place]. This information is not intended to replace advice given to you by your health care provider. Make sure you discuss any questions you have with your health care provider.

## 2014-07-24 DIAGNOSIS — E871 Hypo-osmolality and hyponatremia: Secondary | ICD-10-CM

## 2014-07-24 LAB — GLUCOSE, CAPILLARY
GLUCOSE-CAPILLARY: 167 mg/dL — AB (ref 65–99)
GLUCOSE-CAPILLARY: 242 mg/dL — AB (ref 65–99)
Glucose-Capillary: 198 mg/dL — ABNORMAL HIGH (ref 65–99)

## 2014-07-24 LAB — CULTURE, BLOOD (ROUTINE X 2): CULTURE: NO GROWTH

## 2014-07-24 MED ORDER — RIVAROXABAN 20 MG PO TABS: 20.0000 mg | ORAL_TABLET | Freq: Every day | ORAL | Status: DC

## 2014-07-24 MED ORDER — INSULIN GLARGINE 100 UNIT/ML ~~LOC~~ SOLN: 60.0000 [IU] | Freq: Every day | SUBCUTANEOUS | Status: AC

## 2014-07-24 MED ORDER — INSULIN ASPART 100 UNIT/ML ~~LOC~~ SOLN
6.0000 [IU] | Freq: Three times a day (TID) | SUBCUTANEOUS | Status: DC
Start: 2014-07-24 — End: 2014-07-24
  Administered 2014-07-24: 6 [IU] via SUBCUTANEOUS

## 2014-07-24 MED ORDER — PREDNISONE 10 MG PO TABS: ORAL_TABLET | ORAL | Status: DC

## 2014-07-24 MED ORDER — RIVAROXABAN 15 MG PO TABS: 15.0000 mg | ORAL_TABLET | Freq: Two times a day (BID) | ORAL | Status: DC

## 2014-07-24 NOTE — Progress Notes (Signed)
ANTICOAGULATION CONSULT NOTE - Follow Up Consult  Pharmacy Consult for xarelto Indication: pulmonary embolus  Allergies  Allergen Reactions  . Codeine Anxiety    Patient Measurements: Height: 5\' 6"  (167.6 cm) Weight: 211 lb 6.7 oz (95.9 kg) IBW/kg (Calculated) : 59.3  Vital Signs: Temp: 97.7 F (36.5 C) (07/04 0847) Temp Source: Oral (07/04 0847) BP: 112/59 mmHg (07/04 0847) Pulse Rate: 72 (07/04 0847)  Labs:  Recent Labs  07/22/14 0337 07/23/14 0555  HGB 10.6* 10.7*  HCT 31.7* 32.0*  PLT 308 303  CREATININE 0.90  --     Estimated Creatinine Clearance: 69.8 mL/min (by C-G formula based on Cr of 0.9).   Medications:  Scheduled:  . aspirin EC  81 mg Oral Daily  . colesevelam  1,875 mg Oral BID WC  . feeding supplement (ENSURE ENLIVE)  237 mL Oral BID BM  . fluticasone  1 spray Each Nare QHS  . insulin aspart  0-20 Units Subcutaneous TID WC  . insulin aspart  0-5 Units Subcutaneous QHS  . insulin aspart  6 Units Subcutaneous TID WC  . insulin glargine  60 Units Subcutaneous Daily  . pantoprazole  40 mg Oral BID AC  . predniSONE  20 mg Oral BID WC  . rivaroxaban  15 mg Oral BID   Followed by  . [START ON 08/13/2014] rivaroxaban  20 mg Oral Q supper    Assessment: 69 yo F presented to the ED with hypotension and 6wk hx of weakness. CXR found questionable cavitary lung lesion which prompted CT chest which showed bilateral patchy ground glass opacities and incidental PE  Goal of Therapy:  Monitor platelets by anticoagulation protocol: Yes   Plan:  -Xarelto 15mg  PO BID x 21 days then 20mg  PO qday -Monitor for bleeding -Education complete  Pharmacy to sign off, but are available if needed  Isaac BlissMichael Jahmya Onofrio, PharmD, BCPS Clinical Pharmacist Pager [phone] 07/24/2014 9:47 AM

## 2014-07-24 NOTE — Progress Notes (Signed)
Name: Beverly Carlson MRN: [id number] DOB: 02-14-1945    ADMISSION DATE:  07/18/2014 CONSULTATION DATE:  07/20/14  REFERRING MD :  Dr. Joseph Art   CHIEF COMPLAINT:  Abnormal CT Chest  BRIEF PATIENT DESCRIPTION: 69 y/o F admitted 6/28 for hypotension, weakness, loss of appetite and difficulty swallowing on ACEi which was stopped on admit and PCCM service asked to eval for abn CT chest.    SIGNIFICANT EVENTS  6/28  Admit     STUDIES:  6/28  CT of Chest >> no cavitary lesions, GGO/GG nodules upper lobe predominant distrubution, incidental finding of pulmonary embolus in the segmental R LL pulmonary artery, focal consolidation in RLL (? Pulmonary infarct), mild mediastinal adenopathy  07/21/14  Esophagram  1. Esophageal dysmotility, mildly advanced for age. Small transient hiatal hernia. 2. No other abnormality. No mass or stricture. No evidence of esophagitis. No reflux documented.       SUBJECTIVE:  No longer sense of choking  off acei since admit    VITAL SIGNS: Temp:  [97.7 F (36.5 C)-98.3 F (36.8 C)] 97.7 F (36.5 C) (07/04 0847) Pulse Rate:  [65-75] 72 (07/04 0847) Resp:  [17-18] 18 (07/04 0847) BP: (112-133)/(59-77) 112/59 mmHg (07/04 0847) SpO2:  [98 %-100 %] 100 % (07/04 0847) Weight:  [211 lb 6.7 oz (95.9 kg)] 211 lb 6.7 oz (95.9 kg) (07/03 2100) FIO2 = RA   PHYSICAL EXAMINATION: General:  wdwn adult female in NAD, much better phonation/ no voice fatigue  Neuro:  AAOx4, speech clear, MAE  HEENT:  MM pink/moist, no JVD Cardiovascular:  s1s2 rrr, no m/r/g  Lungs:  resp's even/non-labored, clear bilaterally  Abdomen:  Obese/soft, bsx4 active  Musculoskeletal:  No acute deformities Skin:  Warm/dry, no edema    Recent Labs Lab 07/19/14 0249 07/20/14 0259 07/20/14 0910 07/22/14 0337  NA 132* 131*  --  134*  K 5.1 5.4* 5.1 4.8  CL 101 104  --  103  CO2 16* 14*  --  21*  BUN 24* 15  --  22*  CREATININE 1.18* 1.00  --  0.90  GLUCOSE 118* 206*  --  316*     Recent Labs Lab 07/20/14 0259 07/22/14 0337 07/23/14 0555  HGB 11.5* 10.6* 10.7*  HCT 34.7* 31.7* 32.0*  WBC 12.3* 12.8* 12.0*  PLT 363 308 303   Dg Chest Port 1 View  07/23/2014   CLINICAL DATA:  Infiltrates on imaging study.  EXAM: PORTABLE CHEST - 1 VIEW  COMPARISON:  07/18/2014.  FINDINGS: Cardiopericardial silhouette remains enlarged. No interval change in heart size compared to prior. The peripheral airspace opacities appear improved, particularly when compared to 07/18/2014. No pleural effusion. Eventration of the RIGHT hemidiaphragm. Lower cervical ACDF.  IMPRESSION: Improving peripheral bilateral airspace disease, particularly compared to 07/18/2014.   Electronically Signed   By: Andreas Newport M.D.   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  For now therefore I recommend the patient tper off steroids x 2 weeks then see in office to group   2) dysphagia and voice fatigue are classic equivalent of the ACEi cough in my experience and only way to prove it wrong is 6 weeks trial off > already improving so rec rx gerd for now  and leave off acei indefinitely   3) best case scenario is the eos and the airway symptoms are all acei related, though I've not seen this before - time will tell.   4) HBP doing fine off ACEi > ok to use ARB other than generic  cozar which in some cases mimics acei effects  5) PE rx per [facility] not sure how much this had to do with her symptoms but needs 6 months rx then regroup in terms of risk/ benefit of longterm rx    Pulmonary f/u as inpt in outpt in 2 weeks  Ok for discharge home when [facility] ready.    Sandrea HughsMichael Wert, MD Pulmonary and Critical Care Medicine [facility] Cell [phone] After 5:30 PM or weekends, call [phone]

## 2014-07-24 NOTE — Evaluation (Signed)
Physical Therapy Evaluation Patient Details Name: Beverly Carlson MRN: [id number] DOB: 1945-09-02 Today's Date: 07/24/2014   History of Present Illness  69 year old female adm  07/18/14 through the ED stating she'd been feeling generally weak for approximately 6 weeks with  decreased appetite; pt was + for PE, - for DVTs and without precipitating event;    history of hypertension, diabetes, arthritis, and urinary incontinence   Clinical Impression  Patient evaluated by Physical Therapy with no further acute PT needs identified. All education has been completed and the patient has no further questions. See below for any follow-up Physial Therapy or equipment needs. PT is signing off. Thank you for this referral.  Pt amb ~140' independently this am ( see below for details); O2 sats 92-98% on RA; HR incr to 140s when checked after amb, quickly returned to low 100s; pt in NAD, denies DOE/SOB;  No further needs from our standpoint.     Follow Up Recommendations No PT follow up    Equipment Recommendations  None recommended by PT    Recommendations for Other Services       Precautions / Restrictions        Mobility  Bed Mobility Overal bed mobility: Independent                Transfers Overall transfer level: Independent                  Ambulation/Gait Ambulation/Gait assistance: Independent Ambulation Distance (Feet): 140 Feet Assistive device: None Gait Pattern/deviations: WFL(Within Functional Limits)     General Gait Details: occasional hesitation with gait, incr time to get to heel strike with slight incr posterior wt shift but no overt LOB; pt demo's good safety awareness  Stairs            Wheelchair Mobility    Modified Rankin (Stroke Patients Only)       Balance Overall balance assessment: No apparent balance deficits (not formally assessed)                                           Pertinent Vitals/Pain Pain  Assessment: No/denies pain    Home Living Family/patient expects to be discharged to:: Private residence Living Arrangements: Alone   Type of Home: House         Home Equipment: None      Prior Function Level of Independence: Independent               Hand Dominance        Extremity/Trunk Assessment   Upper Extremity Assessment: Overall WFL for tasks assessed           Lower Extremity Assessment: Overall WFL for tasks assessed         Communication   Communication: No difficulties  Cognition Arousal/Alertness: Awake/alert Behavior During Therapy: WFL for tasks assessed/performed Overall Cognitive Status: Within Functional Limits for tasks assessed                      General Comments      Exercises        Assessment/Plan    PT Assessment Patent does not need any further PT services  PT Diagnosis Difficulty walking   PT Problem List    PT Treatment Interventions     PT Goals (Current goals can be found in the Care Plan section) Acute  Rehab PT Goals PT Goal Formulation: All assessment and education complete, DC therapy    Frequency     Barriers to discharge        Co-evaluation               End of Session   Activity Tolerance: Patient tolerated treatment well Patient left: in chair Nurse Communication: Mobility status         Time: [id number] PT Time Calculation (min) (ACUTE ONLY): 19 min   Charges:   PT Evaluation $Initial PT Evaluation Tier I: 1 Procedure     PT G CodesDrucilla Carlson:        Beverly Carlson 07/24/2014, 9:53 AM

## 2014-07-24 NOTE — Care Management Note (Addendum)
Case Management Note  Patient Details  Name: KIYAH DEMARTINI MRN: [id number] Date of Birth: [birth year]  Subjective/Objective:  69 yo F admitted with + PE.                  Action/Plan: investigate co-pay/pre-authorization of Xarelto for 6 months.    Expected Discharge Date: 07/24/14                  Expected Discharge Plan:  Home/Self Care  In-House Referral:     Discharge planning Services  CM Consult  Post Acute Care Choice:    Choice offered to:     DME Arranged:    DME Agency:     HH Arranged:    HH Agency:     Status of Service:  Completed, signed off  Medicare Important Message Given:  Yes-second notification given Date Medicare IM Given:    Medicare IM give by:    Date Additional Medicare IM Given:   07/24/14 Additional Medicare Important Message give by:   Norina Buzzard  If discussed at Long Length of Stay Meetings, dates discussed:    Additional Comments: met with pt at bedside. She plans to return home. She stated that she is a widowed and she lives alone but she is very independent. She was evaluated by PT and no needs identified. Pt stated that she received some teaching about the medication (Xarelto). Provided pt with a 30 day free trial for Xarelto. Encouraged member to f/u with PCP after D/C.   Norina Buzzard, RN 07/24/2014, 10:17 AM

## 2014-07-24 NOTE — Discharge Summary (Signed)
DISCHARGE SUMMARY  Beverly Carlson  MR#: [id number]  DOB:[birth year]  Date of Admission: 07/18/2014 Date of Discharge: 07/24/2014  Attending Physician:Aunesti Pellegrino T  Patient's WUJ:WJXBJY,NWGNF, MD  Endo:  Dr. Lucianne Muss  Consults:  PCCM - Dr. Sherene Sires  Disposition: D/C home   Follow-up Appts:     Follow-up Information    Follow up with HOPPER,DAVID, MD In 1 week.   Specialty:  Family Medicine   Contact information:   [address] [place] [address] [phone]       Follow up with Sandrea Hughs, MD. Schedule an appointment as soon as possible for a visit in 2 weeks.   Specialty:  Pulmonary Disease   Contact information:   [address] [place] [address] [phone]       Follow up with Reather Littler, MD.   Specialty:  Endocrinology   Why:  Keep your scheduled appointment in one week.   Contact information:   [address] [place] [address] [phone]      Tests Needing Follow-up: -assessment of CBG control with probable need for med titration as steroid taper continues  -assessment of dysphagia sx off ACE and on GERD tx -BP recheck - ACE stopped - may need substitute med - DO NOT USE ACE OR COZAAR -routine CBC check w/ new start of anticoag -in 6 months, d/c of Xarelto and reassessment of risk for PE/DVT  Discharge Diagnoses: Pulmonary emboli  Multifocal regions of ground glass pulmonary opacity as well as ground glass nodules bilateral lungs with mild mediastinal adenopathy Hyponatremia / Dehydration / Hypotension Mild metabolic acidosis Solid food dysphagia - dry cough  DM2 Hx HTN Equivocal UA Obesity - Body mass index is 33.96 kg/(m^2).  HLD  Initial presentation: 69 year old female with history of hypertension, diabetes, arthritis, and urinary incontinence who presented to the ED stating she'd been feeling generally weak for approximately 6 weeks with a decreased appetite with intermittent episodes where she has trouble  swallowing solid foods but no difficulty swallowing liquids. Her weakness/fatingue progressed to the point she had to sit down 3x while simply walking out to her car from church. No dizziness, syncope, or cp. She went for follow-up appt at her PCP office and was sent to the ED due to hypotension.   ED course: cxr with questionable cavitary lung lesions which prompted a CT chest which showed bilateral patchy ground glass opacities and incidental PE. Patient given ivf/rocephin/zitrho.  Hospital Course:  The patient was admitted to the acute units given the symptoms reported above.  Given the CT scan findings pulmonary was consulted.  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  The patient's ACE inhibitor was discontinued.  She was placed on the steroids course and antibiotics were discontinued.  She showed steady consistent improvement during her hospital stay.  Specific treatment details are as noted below.  She incidentally was also found to be suffering with pulmonary emboli.  She was treated initially with full dose Lovenox and then transitioned to Xarelto prior to her discharge.  She will need 6 months of full anticoagulation.  Her other primary complaint at presentation was that of dysphagia.  Interestingly enough this resolved as well after discontinuation of ACE inhibitor.  Please see details of the specific problem listed below.  Pulmonary emboli  Continue full dose anticoagulation for a 6 month course - no clear inciting etiology - have transitioned to oral med prior to d/c -  discussed need for 6 months of anticoag w/ pt w/ plan to reassess her ongoing risk at that time   Multifocal regions of ground glass pulmonary opacity as well as ground glass nodules bilateral lungs with mild mediastinal adenopathy Radiographic findings and history suggestive of idiopathic acute eosinophilic pneumonia - ANCA negative - Pulmonary is  following - steroids being tapered w/ change to oral before d/c and ongoing taper over a 2 week period - for f/u CXR prior to d/c suggested improvement   Hyponatremia / Dehydration / Hypotension Resolved with simple hydration - a.m. cortisol within normal range (obtained prior to prednisone initiation)   Mild metabolic acidosis Resolved w/ volume expansion   Solid food dysphagia Esophagram noted mildly advanced esophageal dysmotility for age with small transient hiatal hernia but no other abnormalities - resumed diet and pt tolerated well  - possibly related to her use of ACE per Dr. Sherene SiresWert - ACE has been stopped   DM2 CBGs very poorly controlled with addition of high-dose steroids - home regimen adjusted and CBG not ideal but improved at time of d/c - beginning steroid taper - pt has f/u w/ her Endo MD in one week   Hx HTN Blood pressure currently well controlled w/o ACE - follow up as outpt - DO NOT RESUME ACE or user Cozaar per Pulm recs   Equivocal UA Findings most likely due to dehydration - remains asymptomatic   Obesity - Body mass index is 33.96 kg/(m^2)  HLD LDL 198? - LFTs normal - pt states she had significant myopathy w/ Lipitor - cont welchol for now     Medication List    STOP taking these medications        bisoprolol-hydrochlorothiazide 10-6.25 MG per tablet  Commonly known as:  ZIAC     fluconazole 150 MG tablet  Commonly known as:  DIFLUCAN     ramipril 10 MG capsule  Commonly known as:  ALTACE      TAKE these medications        aspirin 81 MG tablet  Take 81 mg by mouth daily.     colesevelam 625 MG tablet  Commonly known as:  WELCHOL  Take 3 tablets twice a day     fluticasone 50 MCG/ACT nasal spray  Commonly known as:  FLONASE  Use 2 sprays in each nostril once daily before bedtime for the congestion and postnasal drainage     insulin aspart 100 UNIT/ML FlexPen  Commonly known as:  NOVOLOG FLEXPEN  Inject 16 units in the morning and 28 units  in the evening.     insulin glargine 100 UNIT/ML injection  Commonly known as:  LANTUS  Inject 0.6 mLs (60 Units total) into the skin at bedtime. INJECT 0.5 MLS (50 UNITS TOTAL) INTO THE SKIN AT BEDTIME.     metFORMIN 500 MG 24 hr tablet  Commonly known as:  GLUCOPHAGE-XR  TAKE 2 TABLETS TWICE A DAY     pantoprazole 40 MG tablet  Commonly known as:  PROTONIX  Take one pill daily for acid reflux     predniSONE 10 MG tablet  Commonly known as:  DELTASONE  Take 2 tablets 2x a day for 3 days, then 1 tablet 2x a day for 4 days, then 1 tablet a day for 4 days, then 1/2 tablet (half) a day thereafter until otherwise directed by your doctor     Rivaroxaban 15 MG Tabs tablet  Commonly known as:  XARELTO  Take 1 tablet (15 mg  total) by mouth 2 (two) times daily.     rivaroxaban 20 MG Tabs tablet  Commonly known as:  XARELTO  Take 1 tablet (20 mg total) by mouth daily with supper.  Start taking on:  08/13/2014        Day of Discharge BP 112/59 mmHg  Pulse 72  Temp(Src) 97.7 F (36.5 C) (Oral)  Resp 18  Ht 5\' 6"  (1.676 m)  Wt 95.9 kg (211 lb 6.7 oz)  BMI 34.14 kg/m2  SpO2 100%  Physical Exam: General: No acute respiratory distress - alert and conversant  Lungs: Clear to auscultation bilaterally without wheezes or crackles - good air movement th/o  Cardiovascular: Regular rate and rhythm without murmur gallop or rub normal S1 and S2 Abdomen: Nontender, nondistended, soft, bowel sounds positive, no rebound, no ascites, no appreciable mass Extremities: No significant cyanosis, clubbing, or edema bilateral lower extremities  Basic Metabolic Panel:  Recent Labs Lab 07/18/14 1502 07/19/14 0249 07/20/14 0259 07/20/14 0910 07/22/14 0337  NA 130* 132* 131*  --  134*  K 5.0 5.1 5.4* 5.1 4.8  CL 102 101 104  --  103  CO2  --  16* 14*  --  21*  GLUCOSE 159* 118* 206*  --  316*  BUN 36* 24* 15  --  22*  CREATININE 1.40* 1.18* 1.00  --  0.90  CALCIUM  --  8.5* 8.1*  --  8.2*     Liver Function Tests:  Recent Labs Lab 07/20/14 0259 07/22/14 0337  AST 16 16  ALT 10* 13*  ALKPHOS 88 80  BILITOT 0.9 0.3  PROT 6.0* 6.3*  ALBUMIN 2.5* 2.6*   CBC:  Recent Labs Lab 07/18/14 1450 07/18/14 1502 07/19/14 0249 07/20/14 0259 07/22/14 0337 07/23/14 0555  WBC 13.9*  --  12.0* 12.3* 12.8* 12.0*  NEUTROABS 5.1  --  4.2  --   --   --   HGB 11.9* 13.6 11.7* 11.5* 10.6* 10.7*  HCT 35.3* 40.0 35.1* 34.7* 31.7* 32.0*  MCV 85.1  --  85.8 84.6 83.9 83.6  PLT 436*  --  395 363 308 303    CBG:  Recent Labs Lab 07/23/14 0734 07/23/14 1125 07/23/14 1620 07/23/14 2114 07/24/14 0738  GLUCAP 243* 335* 130* 140* 167*    Recent Results (from the past 240 hour(s))  Culture, blood (routine x 2)     Status: None   Collection Time: 07/18/14  6:42 PM  Result Value Ref Range Status   Specimen Description BLOOD RIGHT HAND  Final   Special Requests BOTTLES DRAWN AEROBIC ONLY 2CC  Final   Culture NO GROWTH 5 DAYS  Final   Report Status 07/23/2014 FINAL  Final  MRSA PCR Screening     Status: None   Collection Time: 07/18/14  8:00 PM  Result Value Ref Range Status   MRSA by PCR NEGATIVE NEGATIVE Final    Comment:        The GeneXpert MRSA Assay (FDA approved for NASAL specimens only), is one component of a comprehensive MRSA colonization surveillance program. It is not intended to diagnose MRSA infection nor to guide or monitor treatment for MRSA infections.   Culture, blood (routine x 2)     Status: None (Preliminary result)   Collection Time: 07/18/14  8:30 PM  Result Value Ref Range Status   Specimen Description BLOOD LEFT HAND  Final   Special Requests IN PEDIATRIC BOTTLE 4CC  Final   Culture NO GROWTH 4 DAYS  Final  Report Status PENDING  Incomplete      Time spent in discharge (includes decision making & examination of pt): >35 minutes  07/24/2014, 10:05 AM   Lonia Blood, MD [facility] Office  [phone] Pager  [phone]  On-Call/Text Page:      [email or web address]      password [facility]

## 2014-07-25 LAB — PROTHROMBIN GENE MUTATION

## 2014-07-25 LAB — FACTOR 5 LEIDEN

## 2014-07-26 ENCOUNTER — Other Ambulatory Visit: Payer: Self-pay | Admitting: Endocrinology

## 2014-07-31 ENCOUNTER — Ambulatory Visit (INDEPENDENT_AMBULATORY_CARE_PROVIDER_SITE_OTHER): Payer: Medicare Other | Admitting: Family Medicine

## 2014-07-31 ENCOUNTER — Other Ambulatory Visit (INDEPENDENT_AMBULATORY_CARE_PROVIDER_SITE_OTHER): Payer: Medicare Other

## 2014-07-31 ENCOUNTER — Ambulatory Visit (INDEPENDENT_AMBULATORY_CARE_PROVIDER_SITE_OTHER): Payer: Medicare Other

## 2014-07-31 VITALS — BP 118/68 | HR 71 | Temp 98.4°F | Resp 17 | Ht 66.0 in | Wt 201.0 lb

## 2014-07-31 DIAGNOSIS — D649 Anemia, unspecified: Secondary | ICD-10-CM

## 2014-07-31 DIAGNOSIS — E160 Drug-induced hypoglycemia without coma: Secondary | ICD-10-CM | POA: Diagnosis not present

## 2014-07-31 DIAGNOSIS — Z86711 Personal history of pulmonary embolism: Secondary | ICD-10-CM

## 2014-07-31 DIAGNOSIS — T383X5A Adverse effect of insulin and oral hypoglycemic [antidiabetic] drugs, initial encounter: Secondary | ICD-10-CM

## 2014-07-31 DIAGNOSIS — M3320 Polymyositis, organ involvement unspecified: Secondary | ICD-10-CM

## 2014-07-31 DIAGNOSIS — Z8701 Personal history of pneumonia (recurrent): Secondary | ICD-10-CM | POA: Diagnosis not present

## 2014-07-31 DIAGNOSIS — E1165 Type 2 diabetes mellitus with hyperglycemia: Secondary | ICD-10-CM

## 2014-07-31 DIAGNOSIS — E1140 Type 2 diabetes mellitus with diabetic neuropathy, unspecified: Secondary | ICD-10-CM | POA: Diagnosis not present

## 2014-07-31 DIAGNOSIS — E118 Type 2 diabetes mellitus with unspecified complications: Secondary | ICD-10-CM | POA: Diagnosis not present

## 2014-07-31 LAB — POCT CBC
Granulocyte percent: 72 %G (ref 37–80)
HCT, POC: 44 % (ref 37.7–47.9)
Hemoglobin: 13.3 g/dL (ref 12.2–16.2)
Lymph, poc: 1.9 (ref 0.6–3.4)
MCH: 25.8 pg — AB (ref 27–31.2)
MCHC: 30.2 g/dL — AB (ref 31.8–35.4)
MCV: 85.4 fL (ref 80–97)
MID (cbc): 0.4 (ref 0–0.9)
MPV: 7.8 fL (ref 0–99.8)
POC Granulocyte: 6 (ref 2–6.9)
POC LYMPH PERCENT: 23.3 %L (ref 10–50)
POC MID %: 4.7 % (ref 0–12)
Platelet Count, POC: 387 10*3/uL (ref 142–424)
RBC: 5.15 M/uL (ref 4.04–5.48)
RDW, POC: 16.8 %
WBC: 8.3 10*3/uL (ref 4.6–10.2)

## 2014-07-31 LAB — COMPREHENSIVE METABOLIC PANEL
ALT: 23 U/L (ref 0–35)
AST: 26 U/L (ref 0–37)
Albumin: 3.9 g/dL (ref 3.5–5.2)
Alkaline Phosphatase: 58 U/L (ref 39–117)
BUN: 24 mg/dL — ABNORMAL HIGH (ref 6–23)
CHLORIDE: 99 meq/L (ref 96–112)
CO2: 29 mEq/L (ref 19–32)
Calcium: 9.4 mg/dL (ref 8.4–10.5)
Creatinine, Ser: 1.05 mg/dL (ref 0.40–1.20)
GFR: 66.86 mL/min (ref >60.00)
Glucose, Bld: 56 mg/dL — ABNORMAL LOW (ref 70–99)
POTASSIUM: 4.5 meq/L (ref 3.5–5.1)
SODIUM: 137 meq/L (ref 135–145)
TOTAL PROTEIN: 7 g/dL (ref 6.0–8.3)
Total Bilirubin: 0.4 mg/dL (ref 0.2–1.2)

## 2014-07-31 LAB — GLUCOSE, POCT (MANUAL RESULT ENTRY)
POC Glucose: 37 mg/dl — AB (ref 70–99)
POC Glucose: 84 mg/dl (ref 70–99)

## 2014-07-31 LAB — HEMOGLOBIN A1C: Hgb A1c MFr Bld: 9.6 % — ABNORMAL HIGH (ref 4.6–6.5)

## 2014-07-31 NOTE — Progress Notes (Signed)
  Subjective:  Patient ID: Beverly Carlson, female    DOB: [birth year]  Age: 69 y.o. MRN: [id number]  69 year old lady who was sent to the hospital 2 weeks ago. It turned out she had had a pulmonary embolus was while her blood pressure was so low. She has continued to be anemic. In the hospital she was diagnosed with possible pneumonia and pulmonary embolus periods that was doing better by the time she left. She was sent home on Xarelto. No source of the pulmonary embolus was found apparently. She has been eating a little bit better. She has not been checking her blood sugars because her meters bad. However she has had some episodes of diaphoresis this she attributed to the Lantus. She takes the Lantus and NovoLog in the evening, and it is couple of hours later that she gets feeling bad and sweaty. She is scheduled to see Dr. Lucianne MussKumar, her endocrinologist, on Thursday. She is on the medicines as updated in her medicine list.   Objective:   Pleasant alert lady who looks like she feels better today. Neck supple without nodes. Chest is clear to auscultation. Heart regular without murmur. Abdomen soft without mass or tenderness. No ankle edema. No thigh or calf tenderness  [facility] reading (PRIMARY) by  Dr. Alwyn RenHopper Mild cardiomegaly..    Assessment & Plan:   Assessment: Recent pulmonary embolus Weight loss Poorly controlled diabetes Probable hypoglycemic episodes in the evenings Appetite improved Anemia, etiology undetermined (she has never had a colonoscopy)  Plan:  Chest x-ray, CBC, glucose, bmet Results for orders placed or performed in visit on 07/31/14  POCT glucose (manual entry)  Result Value Ref Range   POC Glucose 37 (A) 70 - 99 mg/dl  POCT CBC  Result Value Ref Range   WBC 8.3 4.6 - 10.2 K/uL   Lymph, poc 1.9 0.6 - 3.4   POC LYMPH PERCENT 23.3 10 - 50 %L   MID (cbc) 0.4 0 - 0.9   POC MID % 4.7 0 - 12 %M   POC Granulocyte 6.0 2 - 6.9   Granulocyte percent 72.0 37 - 80 %G   RBC 5.15  4.04 - 5.48 M/uL   Hemoglobin 13.3 12.2 - 16.2 g/dL   HCT, POC 40.944.0 81.137.7 - 47.9 %   MCV 85.4 80 - 97 fL   MCH, POC 25.8 (A) 27 - 31.2 pg   MCHC 30.2 (A) 31.8 - 35.4 g/dL   RDW, POC 91.416.8 %   Platelet Count, POC 387 142 - 424 K/uL   MPV 7.8 0 - 99.8 fL    Patient Instructions  Your glucose was 37 today (lab checked it twice and got 36 and 37)  You need to get your glucose meter working promptly so you can check your sugar when you get feeling weak or shaky.    Decrease your Lantus to 40 units daily  Decrease the NovoLog to 10 units in the morning and 20 units in the evening  Talk to Dr. Lucianne MussKumar if your sugars are running too low still  The blood count has improved back to the normal range. I am not sure why it was low, but recommend that it be rechecked again in 1 month.  Continue your blood thinner  Take caution if you see any evidence of bleeding to get checked promptly  Return in one month for recheck     HOPPER,DAVID, MD 07/31/2014

## 2014-07-31 NOTE — Patient Instructions (Addendum)
Your glucose was 37 today (lab checked it twice and got 36 and 37)  You need to get your glucose meter working promptly so you can check your sugar when you get feeling weak or shaky.    Decrease your Lantus to 40 units daily  Decrease the NovoLog to 10 units in the morning and 20 units in the evening  Talk to Dr. Lucianne MussKumar if your sugars are running too low still  The blood count has improved back to the normal range. I am not sure why it was low, but recommend that it be rechecked again in 1 month.  Continue your blood thinner  Take caution if you see any evidence of bleeding to get checked promptly  Return in one month for recheck

## 2014-08-01 ENCOUNTER — Telehealth: Payer: Self-pay | Admitting: *Deleted

## 2014-08-01 LAB — BASIC METABOLIC PANEL
BUN: 22 mg/dL (ref 6–23)
CALCIUM: 9.5 mg/dL (ref 8.4–10.5)
CO2: 27 mEq/L (ref 19–32)
Chloride: 101 mEq/L (ref 96–112)
Creat: 0.95 mg/dL (ref 0.50–1.10)
GLUCOSE: 36 mg/dL — AB (ref 70–99)
Potassium: 4.7 mEq/L (ref 3.5–5.3)
Sodium: 140 mEq/L (ref 135–145)

## 2014-08-01 LAB — CK TOTAL AND CKMB (NOT AT ARMC)
CK MB INDEX: 8.4 ng/mL — AB (ref 0.0–5.3)
Total CK: 121 U/L (ref 24–173)

## 2014-08-01 NOTE — Telephone Encounter (Signed)
Not relevant unless she is having any chest pain.  Please ask patient.  Her muscle enzyme test overall is normal

## 2014-08-01 NOTE — Telephone Encounter (Signed)
Lab called with a critical CKMB of 8.4

## 2014-08-01 NOTE — Telephone Encounter (Signed)
Pt returning call

## 2014-08-02 NOTE — Telephone Encounter (Signed)
Patient states she is not having any chest pain.  

## 2014-08-03 ENCOUNTER — Ambulatory Visit (INDEPENDENT_AMBULATORY_CARE_PROVIDER_SITE_OTHER): Payer: Medicare Other | Admitting: Endocrinology

## 2014-08-03 ENCOUNTER — Encounter: Payer: Self-pay | Admitting: Endocrinology

## 2014-08-03 VITALS — BP 150/86 | HR 80 | Temp 97.8°F | Resp 16 | Ht 66.0 in | Wt 198.2 lb

## 2014-08-03 DIAGNOSIS — R531 Weakness: Secondary | ICD-10-CM | POA: Diagnosis not present

## 2014-08-03 DIAGNOSIS — I10 Essential (primary) hypertension: Secondary | ICD-10-CM | POA: Diagnosis not present

## 2014-08-03 DIAGNOSIS — E7800 Pure hypercholesterolemia, unspecified: Secondary | ICD-10-CM

## 2014-08-03 DIAGNOSIS — IMO0001 Reserved for inherently not codable concepts without codable children: Secondary | ICD-10-CM

## 2014-08-03 DIAGNOSIS — E1165 Type 2 diabetes mellitus with hyperglycemia: Secondary | ICD-10-CM

## 2014-08-03 MED ORDER — IRBESARTAN 150 MG PO TABS
150.0000 mg | ORAL_TABLET | Freq: Every day | ORAL | Status: AC
Start: 2014-08-03 — End: ?

## 2014-08-03 NOTE — Progress Notes (Signed)
Patient ID: Beverly Carlson, female   DOB: 1946-01-12, 69 y.o.   MRN: [id number]   Reason for Appointment: Diabetes follow-up   History of Present Illness   Diagnosis: Type 2 DIABETES MELITUS, date of diagnosis:1992      Previous history: She was initially treated with metformin and subsequently glyburide. In 2002 insulin was added She has been on basal bolus insulin regimen with metformin with variable control over the last few years She is generally noncompliant with followup and glucose monitoring  Recent history:   Insulin regimen: 40  Lantus at bedtime. NovoLog   10 before lunch and 20 with supper        She has not been seen in followup since 10/2013 At that time her A1c was 7.7 Previously has had difficulty checking her blood sugar consistently and usually does not bring her monitor for download Today she has brought her monitor for download but has checked readings only on 4 days this month after her hospital stay and she says she cannot make her meter to work now With her being admitted to the hospital she was put on a steroid taper and her blood sugars were over 200 about a week to 10 days ago Blood sugars were in the 60s on 7/7 but she has not checked any readings since then; blood sugar was also low at 56 in the lab on 7/11 She does not think she has had symptoms in her blood sugars are low  She checks her blood sugars randomly between 11 AM and 8 PM and has no consistent trend Her insulin was reduced at.time of hospital discharge Also she has lost over 35 pounds since her last visit and not clear why Recently has been feeling relatively weak and not able to do much activity     Oral hypoglycemic drugs: WelChol, metformin  2 g      Side effects from medications: None Proper timing of medications in relation to meals: Yes.          Monitors blood glucose:   Glucometer:  FreeStyle         Blood Glucose readings from monitor as above          Meals:  2 meals per  day usually .          Physical activity:  minimal recently Dietitian visit: 2002    Weight control:  Wt Readings from Last 3 Encounters:  08/03/14 198 lb 3.2 oz (89.903 kg)  07/31/14 201 lb (91.173 kg)  07/23/14 211 lb 6.7 oz (95.9 kg)          Complications:  none. Has past history of femoral neuropathy     Diabetes labs:  Lab Results  Component Value Date   HGBA1C 9.6* 07/31/2014   HGBA1C 9.3 07/11/2014   HGBA1C 7.7* 10/17/2013   Lab Results  Component Value Date   MICROALBUR 2.0* 12/13/2012   LDLCALC 198* 07/21/2014   CREATININE 0.95 07/31/2014       Medication List       This list is accurate as of: 08/03/14  1:07 PM.  Always use your most recent med list.               aspirin 81 MG tablet  Take 81 mg by mouth daily.     colesevelam 625 MG tablet  Commonly known as:  WELCHOL  Take 3 tablets twice a day     fluticasone 50 MCG/ACT nasal spray  Commonly  known as:  FLONASE  Use 2 sprays in each nostril once daily before bedtime for the congestion and postnasal drainage     insulin aspart 100 UNIT/ML FlexPen  Commonly known as:  NOVOLOG FLEXPEN  Inject 16 units in the morning and 28 units in the evening.     insulin glargine 100 UNIT/ML injection  Commonly known as:  LANTUS  Inject 0.6 mLs (60 Units total) into the skin at bedtime. INJECT 0.5 MLS (50 UNITS TOTAL) INTO THE SKIN AT BEDTIME.     LANTUS 100 UNIT/ML injection  Generic drug:  insulin glargine  INJECT 50 UNITS INTO THE SKIN AT BEDTIME     irbesartan 150 MG tablet  Commonly known as:  AVAPRO  Take 1 tablet (150 mg total) by mouth daily.     metFORMIN 500 MG 24 hr tablet  Commonly known as:  GLUCOPHAGE-XR  TAKE 2 TABLETS TWICE A DAY     pantoprazole 40 MG tablet  Commonly known as:  PROTONIX  Take one pill daily for acid reflux     predniSONE 10 MG tablet  Commonly known as:  DELTASONE  Take 2 tablets 2x a day for 3 days, then 1 tablet 2x a day for 4 days, then 1 tablet a day for 4  days, then 1/2 tablet (half) a day thereafter until otherwise directed by your doctor     Rivaroxaban 15 MG Tabs tablet  Commonly known as:  XARELTO  Take 1 tablet (15 mg total) by mouth 2 (two) times daily.     rivaroxaban 20 MG Tabs tablet  Commonly known as:  XARELTO  Take 1 tablet (20 mg total) by mouth daily with supper.  Start taking on:  08/13/2014        Allergies:  Allergies  Allergen Reactions  . Codeine Anxiety    Past Medical History  Diagnosis Date  . Diabetes mellitus   . Hypertension   . Bilateral swelling of feet   . Arthritis   . Incontinence     Past Surgical History  Procedure Laterality Date  . Back surgery  2003  . Abdominal hysterectomy  1982    partial    Family History  Problem Relation Age of Onset  . Diabetes Mother   . Heart disease Mother   . Mental illness Father     dementia  . Diabetes Sister   . Diabetes Sister   . Heart disease Sister     Social History:  reports that she quit smoking about 34 years ago. She has never used smokeless tobacco. She reports that she does not drink alcohol or use illicit drugs.  Review of Systems:  Hypertension:  she was told to stop ramipril and also her Ziac in the hospital She had tolerated the ramipril very well for several years previously and she was told that it was causing her difficulty swallowing in the hospital  Lipids: Not well controlled because of not taking statins.  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 She has been on prednisone 2.5 mg previously and has not had any recurrence    No numbness or tingling in her feet Diabetic foot exam 10/15  shows normal monofilament sensation in the toes and plantar surfaces, no skin lesions or  ulcers on the  feet, no ankle edema and normal pedal pulses. Mild puffiness of the feet present    Examination:   BP 150/86 mmHg  Pulse 80  Temp(Src) 97.8 F (36.6 C)  Resp 16  Ht 5\' 6"  (1.676 m)  Wt 198 lb 3.2 oz (89.903 kg)  BMI 32.01 kg/m2  SpO2 99%  Body mass index is 32.01 kg/(m^2).   No ankle edema present  ASSESSMENT/ PLAN:   1. Diabetes type 2  Blood glucose control will need to be assessed better with restarting her glucose monitoring She was using the wrong technique 4 checking her blood sugar and this was demonstrated to her today Since her glucose today in the office is 127 fasting will continue her on 40 units of Lantus Also may need to adjust her mealtime insulin based on her postprandial readings More recently her blood sugars appear to be coming down with taking only 10 mg prednisone currently Continue metformin and WelChol Encouraged her to start being more active as tolerated Consider consultation with dietitian  She will be seen back in follow-up in 6 weeks  2. HYPERTENSION:  Since her blood pressure is starting to increase will start her on Avapro 150 mg daily for now Consider adding Ziac low-dose also if blood pressure is high and she has any tachycardia  3. Lipids need to be treated more aggressively as LDL is 198.  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  Recent pulmonary embolism and pneumonia: Hospital records were reviewed.  She will follow-up with pulmonologist  Counseling time on subjects discussed above is over 50% of today's 25 minute visit   Rodarius Kichline 08/03/2014, 1:07 PM

## 2014-08-03 NOTE — Patient Instructions (Addendum)
Check blood sugars on waking up .. 3 .. times a week Also check blood sugars about 2 hours after a meal and do this after different meals by rotation  Recommended blood sugar levels on waking up is 90-130 and about 2 hours after meal is 140-180 Please bring blood sugar monitor to each visit.  Start Irbesartan for BP  Welchol 3 twice daily

## 2014-08-07 ENCOUNTER — Other Ambulatory Visit: Payer: Self-pay | Admitting: *Deleted

## 2014-08-07 MED ORDER — COLESEVELAM HCL 625 MG PO TABS: ORAL_TABLET | ORAL | Status: AC

## 2014-08-08 ENCOUNTER — Encounter: Payer: Self-pay | Admitting: Internal Medicine

## 2014-08-08 ENCOUNTER — Ambulatory Visit (INDEPENDENT_AMBULATORY_CARE_PROVIDER_SITE_OTHER): Payer: Medicare Other | Admitting: Internal Medicine

## 2014-08-08 VITALS — BP 158/72 | HR 82 | Ht 66.75 in | Wt 199.6 lb

## 2014-08-08 DIAGNOSIS — E669 Obesity, unspecified: Secondary | ICD-10-CM

## 2014-08-08 DIAGNOSIS — I2699 Other pulmonary embolism without acute cor pulmonale: Secondary | ICD-10-CM

## 2014-08-08 DIAGNOSIS — I10 Essential (primary) hypertension: Secondary | ICD-10-CM | POA: Diagnosis not present

## 2014-08-08 DIAGNOSIS — J189 Pneumonia, unspecified organism: Secondary | ICD-10-CM | POA: Diagnosis not present

## 2014-08-08 NOTE — Patient Instructions (Addendum)
Weight control is simply a matter of calorie balance which needs to be tilted in your favor by eating less and exercising more.  To get the most out of exercise, you need to be continuously aware that you are short of breath, but never out of breath, for 30 minutes daily. As you improve, it will actually be easier for you to do the same amount of exercise  in  30 minutes so always push to the level where you are short of breath.  If this does not result in gradual weight reduction then I strongly recommend you see a nutritionist with a food diary x 2 weeks so that we can work out a negative calorie balance which is universally effective in steady weight loss programs.  Think of your calorie balance like you do your bank account where in this case you want the balance to go down so you must take in less calories than you burn up.  It's just that simple:  Hard to do, but easy to understand.  Good luck!   I will communicate with Dr Alwyn RenHopper regarding recommendations for follow up and length of therapy for your blood thinner   If you are satisfied with your treatment plan,  let your doctor know and he/she can either refill your medications or you can return here when your prescription runs out.     If in any way you are not 100% satisfied,  please tell us.  If 100% better, tell your friends!  Pulmonary follow up is as needed   Late add:  Be sure she stops prednisone completely at this point and calls for any cough or sob

## 2014-08-08 NOTE — Progress Notes (Signed)
Subjective:     Patient ID: Beverly Carlson, female   DOB: 03/10/1945, 69 y.o.   MRN: [id number]  HPI BRIEF PATIENT DESCRIPTION: 69 y/o F admitted 6/28 for hypotension, weakness, loss of appetite and difficulty swallowing on ACEi which was stopped on admit and PCCM service asked to eval for abn CT chest.      STUDIES:  6/28 CT of Chest >> no cavitary lesions, GGO/GG nodules upper lobe predominant distrubution, incidental finding of pulmonary embolus in the segmental R LL pulmonary artery, focal consolidation in RLL (? Pulmonary infarct), mild mediastinal adenopathy  07/21/14 Esophagram  1. Esophageal dysmotility, mildly advanced for age. Small transient hiatal hernia. 2. No other abnormality. No mass or stricture. No evidence of esophagitis. No reflux documented.  Discharge Diagnoses: Pulmonary emboli  Multifocal regions of ground glass pulmonary opacity as well as ground glass nodules bilateral lungs with mild mediastinal adenopathy Hyponatremia / Dehydration / Hypotension Mild metabolic acidosis Solid food dysphagia - dry cough  DM2 Hx HTN Equivocal UA Obesity - Body mass index is 33.96 kg/(m^2).  HLD  Initial presentation: 69 year old female with history of hypertension, diabetes, arthritis, and urinary incontinence who presented to the ED stating she'd been feeling generally weak for approximately 6 weeks with a decreased appetite with intermittent episodes where she has trouble swallowing solid foods but no difficulty swallowing liquids. Her weakness/fatingue progressed to the point she had to sit down 3x while simply walking out to her car from church. No dizziness, syncope, or cp. She went for follow-up appt at her PCP office and was sent to the ED due to hypotension.   ED course: cxr with questionable cavitary lung lesions which prompted a CT chest which showed bilateral patchy ground glass opacities and incidental PE. Patient given ivf/rocephin/zitrho.  Hospital  Course:  The patient was admitted to the acute units given the symptoms reported above. Given the CT scan findings pulmonary was consulted. 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 The patient's ACE inhibitor was discontinued. She was placed on the steroids course and antibiotics were discontinued. She showed steady consistent improvement during her hospital stay. Specific treatment details are as noted below. She incidentally was also found to be suffering with pulmonary emboli. She was treated initially with full dose Lovenox and then transitioned to Xarelto prior to her discharge. She will need 6 months of full anticoagulation. Her other primary complaint at presentation was that of dysphagia. Interestingly enough this resolved as well after discontinuation of ACE inhibitor. Please see details of the specific problem listed below.  Discharged 07/24/14    08/08/2014 ext post hosp f/u ov/Seanne Chirico re:  Eos pna/ hoarsness and dysphagia ? acei related / incidental small PE Chief Complaint  Patient presents with  . HFU    Pt states that she is feeling well and denies any co's today.     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 No unusual exp hx or h/o childhood pna/ asthma or knowledge of premature birth.  Sleeping ok without nocturnal  or early am exacerbation  of respiratory  c/o's or need for noct saba. Also denies any obvious  fluctuation of symptoms with weather or environmental changes or other aggravating or alleviating factors except as outlined above   Current Medications, Allergies, Complete Past Medical History, Past Surgical  History, Family History, and Social History were reviewed in [employer] record.  ROS  The following are not active complaints unless bolded sore throat, dysphagia, dental problems, itching, sneezing,  nasal congestion or excess/ purulent secretions, ear ache,   fever, chills, sweats, unintended wt loss, classically pleuritic or exertional cp, hemoptysis,  orthopnea pnd or leg swelling, presyncope, palpitations, abdominal pain, anorexia, nausea, vomiting, diarrhea  or change in bowel or bladder habits, change in stools or urine, dysuria,hematuria,  rash, arthralgias, visual complaints, headache, numbness, weakness or ataxia or problems with walking or coordination,  change in mood/affect or memory.         Review of Systems     Objective:   Physical Exam    pleasant amb very talkative  bf nad with excellent phonation now   Wt Readings from Last 3 Encounters:  08/08/14 199 lb 9.6 oz (90.538 kg)  08/03/14 198 lb 3.2 oz (89.903 kg)  07/31/14 201 lb (91.173 kg)    Vital signs reviewed  HEENT: nl dentition, turbinates, and orophanx. Nl external ear canals without cough reflex   NECK :  without JVD/Nodes/TM/ nl carotid upstrokes bilaterally   LUNGS: no acc muscle use, clear to A and P bilaterally without cough on insp or exp maneuvers   CV:  RRR  no s3 or murmur or increase in P2, no edema   ABD:  soft and nontender with nl excursion in the supine position. No bruits or organomegaly, bowel sounds nl  MS:  warm without deformities, calf tenderness, cyanosis or clubbing  SKIN: warm and dry without lesions    NEURO:  alert, approp, no deficits      I personally reviewed images and agree with radiology impression as follows:  CXR:  07/31/14 Mild cardiomegaly without failure. Resolution of peripheral airspace disease present on recent prior exams.     Assessment:

## 2014-08-10 ENCOUNTER — Encounter: Payer: Self-pay | Admitting: Internal Medicine

## 2014-08-10 ENCOUNTER — Telehealth: Payer: Self-pay | Admitting: *Deleted

## 2014-08-10 DIAGNOSIS — E669 Obesity, unspecified: Secondary | ICD-10-CM | POA: Insufficient documentation

## 2014-08-10 NOTE — Assessment & Plan Note (Signed)
07/19/14  Bilateral Venous Doppler No DVT noted in visualized veins but TDS (pt did not allow compression)   I had an extended final summary discussion with the patient reviewing all relevant studies completed to date and  lasting92minutes of a 40 minute visit on the following issues:   PE were small and incidental and probably due to obesity which will need to be addressed and considered as a risk factor when decision is made whether to stop NOAC or use the lower dose program (elaquis 2.5 bid) or asa but for now she's doing great   Obesity and hbp issues sep discussed (see a/p)

## 2014-08-10 NOTE — Telephone Encounter (Signed)
I called made pt aware of below. She reports she is on pred 2.5 mg by a [facility] doc for polymyositis--weakness of the muscles.  The [facility] doc does not want to d/c this and has pending appt in sept.

## 2014-08-10 NOTE — Assessment & Plan Note (Addendum)
See note 07/22/14 Most likely this was eos pna but hard to prove > resolved on steroids and has not recurred ? Could have been ACEi related too?)  - 08/08/2014  Walked RA x 3 laps @ 185 ft each stopped due to End of study, nl pace, no sob or desat    Only on a physiologic dose of steroids now, ok to d/c

## 2014-08-10 NOTE — Telephone Encounter (Signed)
-----   Message from Nyoka Cowden, MD sent at 08/10/2014  6:29 AM EDT -----   Be sure she stops prednisone completely at this point and calls for any cough or sob

## 2014-08-10 NOTE — Assessment & Plan Note (Signed)
Complicated by Pe 07/18/14 / dm/ hbp  Body mass index is 31.51 kg/(m^2).  Lab Results  Component Value Date   TSH 3.254 07/18/2014     Contributing to gerd tendency/ doe/ needs to achieve and maintain neg calorie balance >f/u primary care

## 2014-08-10 NOTE — Assessment & Plan Note (Addendum)
D/c acei 07/22/14 due to hoarsenss/ dysphagia > resolved   ACE inhibitors are problematic in  pts with airway complaints because  even experienced pulmonologists can't always distinguish ace effects from copd/asthma/pnds/ allergies etc.  By themselves they don't actually cause a problem, much like oxygen can't by itself start a fire, but they certainly serve as a powerful catalyst or enhancer for any "fire"  or inflammatory process in the upper airway, be it caused by an ET  tube or more commonly reflux (especially in the obese or pts with known GERD or who are on biphoshonates) or URI's, due to interference with bradykinin clearance.  The effects of acei on bradykinin levels occurs in 100% of pt's on acei (unless they surreptitiously stop the med!) but the classic cough is only reported in 5%.  This leaves 95% of pts on acei's  with a variety of syndromes including no identifiable symptom in most  vs non-specific symptoms  (like hoarseness and dysphagia)  that wax and wane depending on what other insult is occuring at the level of the upper airway.  Would avoid ACEi here, no problem with ARB though I'm also avoiding losartan since it came out as generic we've seen a few acei like cases via a different mechanism

## 2014-08-10 NOTE — Telephone Encounter (Signed)
That's absolutely fine, didn't realize there was an established floor but the warning to call if flare still applies as her lung problem may flare at any point especially if she has underlying polymiositis and she needs to let the [facility] doctor know when she sees him about her admit (he can look it up in care everywhere)

## 2014-08-10 NOTE — Telephone Encounter (Signed)
LMTCB for the pt 

## 2014-08-10 NOTE — Telephone Encounter (Signed)
[phone], pt cb

## 2014-08-11 NOTE — Telephone Encounter (Signed)
Pt is aware of below. Nothing further needed 

## 2014-08-29 ENCOUNTER — Ambulatory Visit (INDEPENDENT_AMBULATORY_CARE_PROVIDER_SITE_OTHER): Payer: Medicare Other | Admitting: Family Medicine

## 2014-08-29 VITALS — BP 108/58 | HR 70 | Temp 98.2°F | Resp 14 | Ht 66.75 in | Wt 203.0 lb

## 2014-08-29 DIAGNOSIS — Z888 Allergy status to other drugs, medicaments and biological substances status: Secondary | ICD-10-CM | POA: Insufficient documentation

## 2014-08-29 DIAGNOSIS — I10 Essential (primary) hypertension: Secondary | ICD-10-CM | POA: Diagnosis not present

## 2014-08-29 DIAGNOSIS — Z86711 Personal history of pulmonary embolism: Secondary | ICD-10-CM | POA: Diagnosis not present

## 2014-08-29 DIAGNOSIS — Z7901 Long term (current) use of anticoagulants: Secondary | ICD-10-CM | POA: Diagnosis not present

## 2014-08-29 DIAGNOSIS — M129 Arthropathy, unspecified: Secondary | ICD-10-CM

## 2014-08-29 DIAGNOSIS — M1711 Unilateral primary osteoarthritis, right knee: Secondary | ICD-10-CM | POA: Insufficient documentation

## 2014-08-29 DIAGNOSIS — M7552 Bursitis of left shoulder: Secondary | ICD-10-CM | POA: Diagnosis not present

## 2014-08-29 MED ORDER — TRAMADOL HCL 50 MG PO TABS: 50.0000 mg | ORAL_TABLET | Freq: Three times a day (TID) | ORAL | Status: AC | PRN

## 2014-08-29 NOTE — Patient Instructions (Signed)
Take Tylenol (acetaminophen) 500 mg 2 pills 3 times daily if needed for milder pain  Take tramadol 50 mg every 6 hours as needed for more severe pain  Tramadol and Tylenol (acetaminophen) are actually a very good combination and give added pain relief  Apply ice to the shoulder with an ice pack for about 15 or 20 minutes for 5 times a day for the next few days  If the pain is not doing any better by Friday or Saturday come back and I will give a cortisone injection, but realizes that people on blood thinners have a little higher risk of bleeding into the joint when we inject it.  Continue your current medications.  Avoid the class of medicine called ACE inhibitors (Altace and that whole class of medicines)  The new blood pressure medication that they have started you on is from the class of medicines called ARB.  This class is a cousin of the ACE inhibitors, and if you have further pneumonia episode you might have to be taken off of it also. However since you're doing well on it at this time I think we should continue it.

## 2014-08-29 NOTE — Progress Notes (Signed)
  Subjective:  Patient ID: Beverly Carlson, female    DOB: 24-Jan-1945  Age: 69 y.o. MRN: [id number]  Last time I saw this patient a month ago we sent her to the hospital. It turned out she had had a pulmonary embolus. 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 She was in the hospital almost a week. She has since gone back to Dr. Lucianne Muss who put her back on a blood pressure medicine, going with an ARB. She has seen Dr. Sherene Sires back and he is following on the lungs. She has done well with her lungs since being in the hospital. She's had some arthritic pain and crepitance in the right knee. That's been going on a little bit longer, but since yesterday she's had severe pain in her left shoulder. No injury. She does carry a fairly heavy purse sometimes. It hurts her move the left shoulder around. His not had any history of bursitis in that shoulder in the past. No injury.   Objective:   Pleasant alert lady known to me. Her left shoulder is very tender, especially in the subacromial area. Right knee has fairly good range of motion, some crepitance, no effusion.  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 We'll give her pain medication and let her keep icing the shoulder. If she keeps having problems can inject the shoulder though there is a higher chance of bruising and bleeding into the shoulder since she is on the Xarelto.  Patient Instructions  Take Tylenol (acetaminophen) 500 mg 2 pills 3 times daily if needed for milder pain  Take tramadol 50 mg every 6 hours as needed for more severe pain  Tramadol and Tylenol (acetaminophen) are actually  a very good combination and give added pain relief  Apply ice to the shoulder with an ice pack for about 15 or 20 minutes for 5 times a day for the next few days  If the pain is not doing any better by Friday or Saturday come back and I will give a cortisone injection, but realizes that people on blood thinners have a little higher risk of bleeding into the joint when we inject it.  Continue your current medications.  Avoid the class of medicine called ACE inhibitors (Altace and that whole class of medicines)  The new blood pressure medication that they have started you on is from the class of medicines called ARB.  This class is a cousin of the ACE inhibitors, and if you have further pneumonia episode you might have to be taken off of it also. However since you're doing well on it at this time I think we should continue it.    HOPPER,DAVID, MD 08/29/2014

## 2014-08-31 ENCOUNTER — Ambulatory Visit (INDEPENDENT_AMBULATORY_CARE_PROVIDER_SITE_OTHER): Payer: Medicare Other | Admitting: Podiatry

## 2014-08-31 ENCOUNTER — Encounter: Payer: Self-pay | Admitting: Podiatry

## 2014-08-31 DIAGNOSIS — E1140 Type 2 diabetes mellitus with diabetic neuropathy, unspecified: Secondary | ICD-10-CM | POA: Diagnosis not present

## 2014-08-31 DIAGNOSIS — M79676 Pain in unspecified toe(s): Secondary | ICD-10-CM

## 2014-08-31 DIAGNOSIS — B351 Tinea unguium: Secondary | ICD-10-CM | POA: Diagnosis not present

## 2014-08-31 NOTE — Progress Notes (Signed)
Patient ID: Beverly Carlson, female   DOB: [birth year], 69 y.o.   MRN: [id number] Complaint:  Visit Type: Patient returns to my office for continued preventative foot care services. Complaint: Patient states" my nails have grown long and thick and become painful to walk and wear shoes" Patient has been diagnosed with DM with neuropathy.. The patient presents for preventative foot care services. No changes to ROS  Podiatric Exam: Vascular: dorsalis pedis and posterior tibial pulses are palpable bilateral. Capillary return is immediate. Temperature gradient is WNL. Skin turgor WNL  Sensorium: Diminished  Semmes Weinstein monofilament test. Normal tactile sensation bilaterally. Nail Exam: Pt has thick disfigured discolored nails with subungual debris noted bilateral entire nail hallux through fifth toenails Ulcer Exam: There is no evidence of ulcer or pre-ulcerative changes or infection. Orthopedic Exam: Muscle tone and strength are WNL. No limitations in general ROM. No crepitus or effusions noted. Foot type and digits show no abnormalities. Bony prominences are unremarkable. Skin: No Porokeratosis. No infection or ulcers  Diagnosis:  Onychomycosis, , Pain in right toe, pain in left toes  Treatment & Plan Procedures and Treatment: Consent by patient was obtained for treatment procedures. The patient understood the discussion of treatment and procedures well. All questions were answered thoroughly reviewed. Debridement of mycotic and hypertrophic toenails, 1 through 5 bilateral and clearing of subungual debris. No ulceration, no infection noted.  Return Visit-Office Procedure: Patient instructed to return to the office for a follow up visit 3 months for continued evaluation and treatment.

## 2014-09-03 ENCOUNTER — Other Ambulatory Visit: Payer: Self-pay | Admitting: Family Medicine

## 2014-09-08 ENCOUNTER — Encounter: Payer: Self-pay | Admitting: Endocrinology

## 2014-09-08 ENCOUNTER — Ambulatory Visit (INDEPENDENT_AMBULATORY_CARE_PROVIDER_SITE_OTHER): Payer: Medicare Other | Admitting: Endocrinology

## 2014-09-08 VITALS — BP 130/80 | HR 99 | Temp 98.2°F | Resp 16 | Ht 66.75 in | Wt 193.2 lb

## 2014-09-08 DIAGNOSIS — M3320 Polymyositis, organ involvement unspecified: Secondary | ICD-10-CM

## 2014-09-08 DIAGNOSIS — E7800 Pure hypercholesterolemia, unspecified: Secondary | ICD-10-CM

## 2014-09-08 DIAGNOSIS — IMO0001 Reserved for inherently not codable concepts without codable children: Secondary | ICD-10-CM

## 2014-09-08 DIAGNOSIS — E1165 Type 2 diabetes mellitus with hyperglycemia: Secondary | ICD-10-CM | POA: Diagnosis not present

## 2014-09-08 MED ORDER — PRAVASTATIN SODIUM 20 MG PO TABS: 20.0000 mg | ORAL_TABLET | Freq: Every day | ORAL | Status: AC

## 2014-09-08 NOTE — Patient Instructions (Signed)
20 lanus at nite  Check blood sugars on waking up .Marland Kitchen4-6  .Marland Kitchen times a week Also check blood sugars about 2 hours after a meal and do this after different meals by rotation  Recommended blood sugar levels on waking up is 90-130 and about 2 hours after meal is 140-180 Please bring blood sugar monitor to each visit.

## 2014-09-08 NOTE — Progress Notes (Signed)
Patient ID: Beverly Carlson, female   DOB: [birth year], 69 y.o.   MRN: [id number]   Reason for Appointment: Diabetes and blood pressure follow-up   History of Present Illness   Diagnosis: Type 2 DIABETES MELITUS, date of diagnosis:1992      Previous history: She was initially treated with metformin and subsequently glyburide. In 2002 insulin was added She has been on basal bolus insulin regimen with metformin with variable control over the last few years She is generally noncompliant with followup and glucose monitoring  Recent history:   Insulin regimen: 0-40 Lantus at bedtime. NovoLog 10 before lunch and 20 with supper        On her last visit she was not able to check her blood sugar monitor and was given a new glucose monitor However she has not brought this in for download Her A1c was markedly increased at 9.6 in July She had been on higher doses of steroids and these have been tapered down to 2.5 mg over the last couple of months  Current blood sugar patterns and problems identified:  She generally appears to have lower readings than expected for her A1c  She now says that about a week or 2 ago she was waking up at 5 AM with sweating and blood sugars in the 40s and 50s; she did not call and stop her Lantus on her own  She is checking her blood sugar only sporadically and only couple of times a week  She thinks blood sugars are not high after meals but not clear how often she is monitoring  No recent labs available to assess her blood sugars and objectively     Oral hypoglycemic drugs: WelChol, metformin  2 g      Side effects from medications: None Proper timing of medications in relation to meals: Yes.          Monitors blood glucose:   Glucometer:  FreeStyle         Blood Glucose readings from recall Am 40 -60 Pcs <162         Meals:  2 meals per day usually.          Physical activity:  minimal recently Dietitian visit: 2002    Weight control:  Wt  Readings from Last 3 Encounters:  09/08/14 193 lb 3.2 oz (87.635 kg)  08/29/14 203 lb (92.08 kg)  08/08/14 199 lb 9.6 oz (90.538 kg)          Complications:  none. Has past history of femoral neuropathy     Diabetes labs:  Lab Results  Component Value Date   HGBA1C 9.6* 07/31/2014   HGBA1C 9.3 07/11/2014   HGBA1C 7.7* 10/17/2013   Lab Results  Component Value Date   MICROALBUR 2.0* 12/13/2012   LDLCALC 198* 07/21/2014   CREATININE 0.95 07/31/2014       Medication List       This list is accurate as of: 09/08/14 11:59 PM.  Always use your most recent med list.               aspirin 81 MG tablet  Take 81 mg by mouth daily.     BD PEN NEEDLE NANO U/F 32G X 4 MM Misc  Generic drug:  Insulin Pen Needle     bisoprolol-hydrochlorothiazide 10-6.25 MG per tablet  Commonly known as:  ZIAC     colesevelam 625 MG tablet  Commonly known as:  WELCHOL  Take 3 tablets twice a  day     fluticasone 50 MCG/ACT nasal spray  Commonly known as:  FLONASE  Use 2 sprays in each nostril once daily before bedtime for the congestion and postnasal drainage     insulin aspart 100 UNIT/ML FlexPen  Commonly known as:  NOVOLOG FLEXPEN  Inject 16 units in the morning and 28 units in the evening.     insulin glargine 100 UNIT/ML injection  Commonly known as:  LANTUS  Inject 0.6 mLs (60 Units total) into the skin at bedtime. INJECT 0.5 MLS (50 UNITS TOTAL) INTO THE SKIN AT BEDTIME.     irbesartan 150 MG tablet  Commonly known as:  AVAPRO  Take 1 tablet (150 mg total) by mouth daily.     metFORMIN 500 MG 24 hr tablet  Commonly known as:  GLUCOPHAGE-XR  TAKE 2 TABLETS TWICE A DAY     pantoprazole 40 MG tablet  Commonly known as:  PROTONIX  TAKE ONE PILL DAILY FOR ACID REFLUX     pravastatin 20 MG tablet  Commonly known as:  PRAVACHOL  Take 1 tablet (20 mg total) by mouth daily.     predniSONE 2.5 MG tablet  Commonly known as:  DELTASONE     traMADol 50 MG tablet  Commonly known  as:  ULTRAM  Take 1 tablet (50 mg total) by mouth every 8 (eight) hours as needed.     XARELTO 20 MG Tabs tablet  Generic drug:  rivaroxaban        Allergies:  Allergies  Allergen Reactions  . Ace Inhibitors   . Codeine Anxiety    Past Medical History  Diagnosis Date  . Diabetes mellitus   . Hypertension   . Bilateral swelling of feet   . Arthritis   . Incontinence     Past Surgical History  Procedure Laterality Date  . Back surgery  2003  . Abdominal hysterectomy  1982    partial    Family History  Problem Relation Age of Onset  . Diabetes Mother   . Heart disease Mother   . Mental illness Father     dementia  . Diabetes Sister   . Diabetes Sister   . Heart disease Sister     Social History:  reports that she quit smoking about 35 years ago. Her smoking use included Cigarettes. She has a 3.5 pack-year smoking history. She has never used smokeless tobacco. She reports that she does not drink alcohol or use illicit drugs.  Review of Systems:  Hypertension:  she was told to stop ramipril and also her Ziac in the hospital She had tolerated the ramipril very well for several years previously and she was told that it was causing her difficulty swallowing in the hospital She is now taking Avapro 150 mg as her blood pressure was high in 7/16  Lipids: Not well controlled because of not taking statins as recommended by her neurologist.  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 She has been on prednisone 2.5 mg and has not had any recurrence    No numbness or tingling in her feet Diabetic foot exam 10/15  shows normal monofilament sensation in the toes and  plantar  surfaces, no skin lesions or ulcers on the feet, no ankle edema and normal pedal pulses. Mild puffiness of the feet present    Examination:   BP 130/80 mmHg  Pulse 99  Temp(Src) 98.2 F (36.8 C)  Resp 16  Ht 5' 6.75" (1.695 m)  Wt 193 lb 3.2 oz (87.635 kg)  BMI 30.50 kg/m2  SpO2 98%  Body mass index is 30.5 kg/(m^2).   No ankle edema present  ASSESSMENT/ PLAN:   1. Diabetes type 2  Blood glucose control is overall improved and with her steroids being tapered off she recently had overnight hypoglycemia However on her own she stopped her Lantus completely instead of reducing it from her previous dose of 40 units  Her home blood sugars will need to be assessed better with review of her home monitoring and she did not bring her meter today Also most likely is not checking her sugars enough to identify any patterns including after meals She does not report significantly high readings recently  For now she can start with only 20 units of Lantus and discussed that this will be adjusted if fasting readings are out of range She can call us if she has any difficulties with hypoglycemia  She will be seen back in follow-up in 6 weeks  2. HYPERTENSION:  Since her blood pressure is well-controlled will continue her Avapro 150 mg  3. Lipids need to be treated more aggressively as LDL is 198.  She will start 20 mg pravastatin and consider eventually going to higher doses and adjust her medication based on lipid panels, will monitor her liver functions and CPK   Nirvan Laban 09/09/2014, 11:52 AM

## 2014-09-15 ENCOUNTER — Telehealth: Payer: Self-pay | Admitting: *Deleted

## 2014-09-15 NOTE — Telephone Encounter (Signed)
Patient called about her Lantus.  She said she had been taking the 40 units and then you decreased it to 20 units, patient states she is afraid to even take that because she's still having the lows. She said they have been down as low as 40, but she doesn't have time to check it normally when she feels this way,  and this wakes her up, she's sweaty, feels very nervous.  Please advise.

## 2014-09-15 NOTE — Telephone Encounter (Signed)
Noted, patient is aware. 

## 2014-09-15 NOTE — Telephone Encounter (Signed)
She needs to check her sugar 3-4 times a day and call me on Monday with the results.  She can hold off on Lantus for now

## 2014-09-18 ENCOUNTER — Other Ambulatory Visit: Payer: Self-pay | Admitting: Endocrinology

## 2014-09-19 ENCOUNTER — Telehealth: Payer: Self-pay | Admitting: Internal Medicine

## 2014-09-19 NOTE — Telephone Encounter (Signed)
Called and spoke with pt Pt stated seeing MW on 08/08/14 for f/u of PE Pt was placed on xarleto and is currently taking xarleto  QD Pt is confused as whether Dr Sherene Sires is suppose to refill this or if she needs to contact Dr Frederik Pear office  Dr Sherene Sires, please advise. Thanks

## 2014-09-20 ENCOUNTER — Other Ambulatory Visit: Payer: Self-pay | Admitting: Family Medicine

## 2014-09-20 ENCOUNTER — Telehealth: Payer: Self-pay

## 2014-09-20 DIAGNOSIS — Z7901 Long term (current) use of anticoagulants: Secondary | ICD-10-CM

## 2014-09-20 DIAGNOSIS — Z86711 Personal history of pulmonary embolism: Secondary | ICD-10-CM

## 2014-09-20 NOTE — Telephone Encounter (Signed)
XARELTO 20 MG TABS tablet [[id number]]

## 2014-09-20 NOTE — Telephone Encounter (Signed)
Pt returned call. Informed pt of the recs per Dr. Sherene Sires. Pt verbalized understanding and denied any further questions or concerns at this time.

## 2014-09-20 NOTE — Telephone Encounter (Signed)
LMTCB

## 2014-09-20 NOTE — Telephone Encounter (Signed)
Patient is calling because she wants to know if she needs to be seen to get zorelta. She states that Dr. Alwyn Ren has been communicating with another provider to work on getting the medication approved. Please advise!

## 2014-09-20 NOTE — Telephone Encounter (Signed)
Instructions were already given to her at the ov re all meds:  " will communicate with Dr Alwyn Ren regarding recommendations for follow up and length of therapy for your blood thinner  If you are satisfied with your treatment plan, let your doctor know and he/she can either refill your medications or you can return here when your prescription runs out.   If in any way you are not 100% satisfied, please tell us. If 100% better, tell your friends!  Pulmonary follow up is as needed "

## 2014-09-22 MED ORDER — XARELTO 20 MG PO TABS: 20.0000 mg | ORAL_TABLET | Freq: Every day | ORAL | Status: AC

## 2014-09-22 NOTE — Telephone Encounter (Signed)
Pt called in checking on status of this message.Please advise

## 2014-09-22 NOTE — Telephone Encounter (Signed)
[employer] [place], [place] noted.  Can continue to refill this until about December.

## 2014-09-22 NOTE — Telephone Encounter (Signed)
I am refilling the medication for 30 tablets, xarelto 20 mg qsupper.  I will alert Dr. Alwyn Ren of her need for continued therapy.   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  Anti-coagulant started.  Transition in hospital from lovenox to xarelto.  She has 2 tablets left.  Recommended at least 3 months of AC therapy.    Dr.Wertz will communicate with Dr. Alwyn Ren regarding recommendations for f/u and length of therapy.  I will send relay my medication refill today, as he can decide her further treatment along side of Dr. Shona Simpson (pulmonologist).

## 2014-09-22 NOTE — Telephone Encounter (Signed)
Can a PA help? 

## 2014-10-05 ENCOUNTER — Encounter: Payer: Self-pay | Admitting: Family Medicine

## 2014-10-05 ENCOUNTER — Telehealth: Payer: Self-pay

## 2014-10-05 NOTE — Telephone Encounter (Signed)
Dr. Alwyn Ren signed death certificate, in the pickup drawer at 102, funeral home notified.

## 2014-10-05 NOTE — Progress Notes (Signed)
Received a phone call on September 6 at about 1 AM from EMS. I got up and looked in the computer to recall the details regarding the patient. She had a history of fairly recent pulmonary emboli, was on anticoagulation therapy, had diabetes which had been in fairly good control but she did have a history of some hypoglycemia in the past. It was felt that she had died of natural causes, from sudden death. Most likely this was pulmonary embolus though could've been cardiac in origin. Was not a medical examiner case. EMS was informed that I would attempt to sign the death certificate.

## 2014-10-20 ENCOUNTER — Ambulatory Visit: Payer: Medicare Other | Admitting: Endocrinology

## 2014-10-21 DEATH — deceased

## 2014-10-23 ENCOUNTER — Other Ambulatory Visit: Payer: Medicare Other

## 2014-10-26 ENCOUNTER — Ambulatory Visit: Payer: Medicare Other | Admitting: Endocrinology

## 2014-12-05 ENCOUNTER — Ambulatory Visit: Payer: Medicare Other | Admitting: Podiatry

## 2016-10-24 IMAGING — RF DG ESOPHAGUS
13 series · 14 of 14 positions shown · non-contrast
Comparison: CT, 07/18/2014.

CLINICAL DATA: Loss of appetite, difficulty swallowing. Weaknessfor
6 weeks.difficulty swallowing solids, dry heaves and reflux of food.
no difficulty swallowing liquids.

EXAM:
ESOPHOGRAM/BARIUM SWALLOW
TECHNIQUE: Combined double contrast and single contrast examination performed
using effervescent crystals, thick barium liquid, and thin barium
liquid.
FLUOROSCOPY TIME:  Radiation Exposure Index (as provided by the
fluoroscopic device): 35.4 mGy
If the device does not provide the exposure index:
Fluoroscopy Time:  1 minutes and 6 seconds
Number of Acquired Images:  11

[Series 1: fluoro_barium 2fps_bw · 0.17mm/px · 2 of 2 frames shown (1 of 13)]
[frame 1/2]
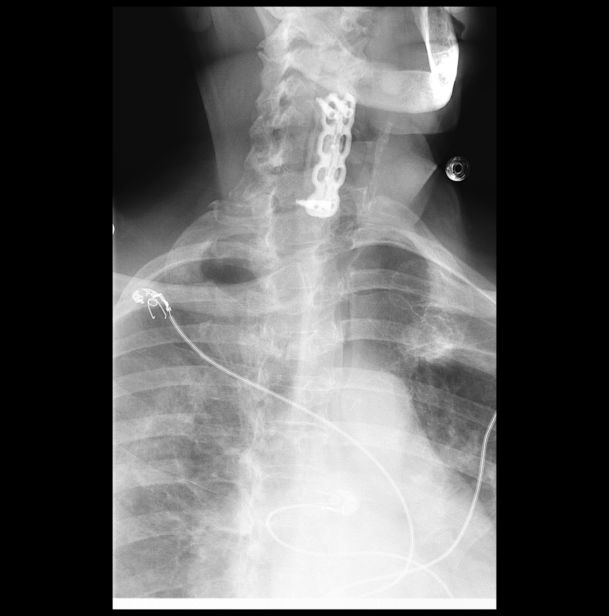
[frame 2/2]
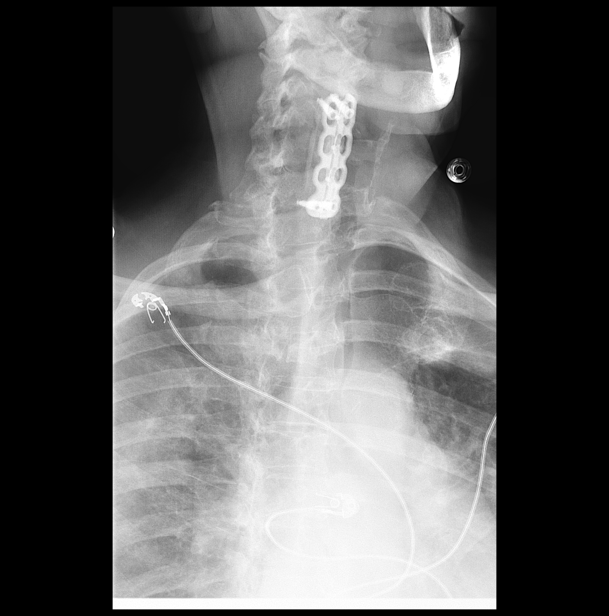

[Series 2: fluoro_barium 2fps_bw · 0.17mm/px · 1 of 1 slices shown (2 of 13)]
[im 1/1]
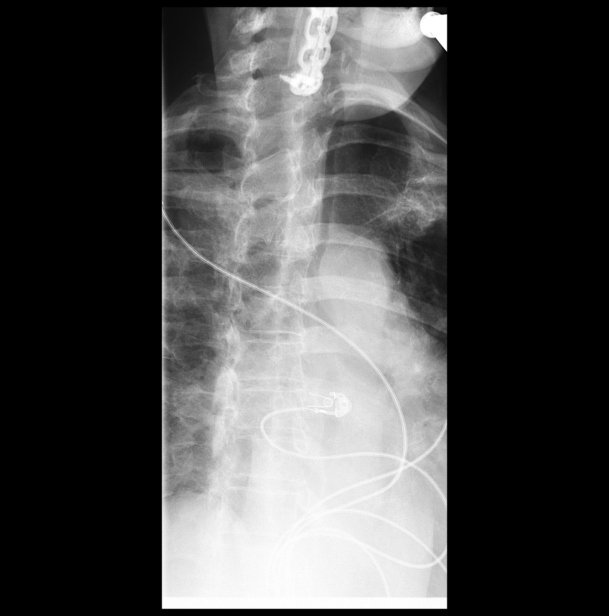

[Series 3: fluoro_barium 2fps_bw · 0.17mm/px · 1 of 1 slices shown (3 of 13)]
[im 1/1]
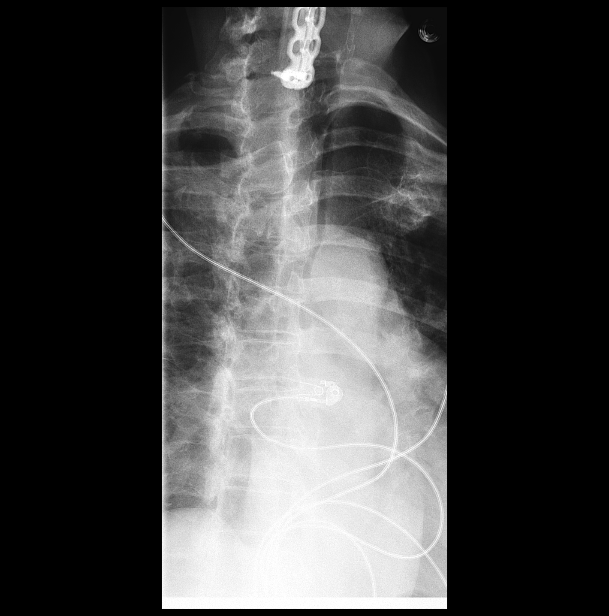

[Series 4: fluoro_barium 2fps_bw · 0.17mm/px · 1 of 1 slices shown (4 of 13)]
[im 1/1]
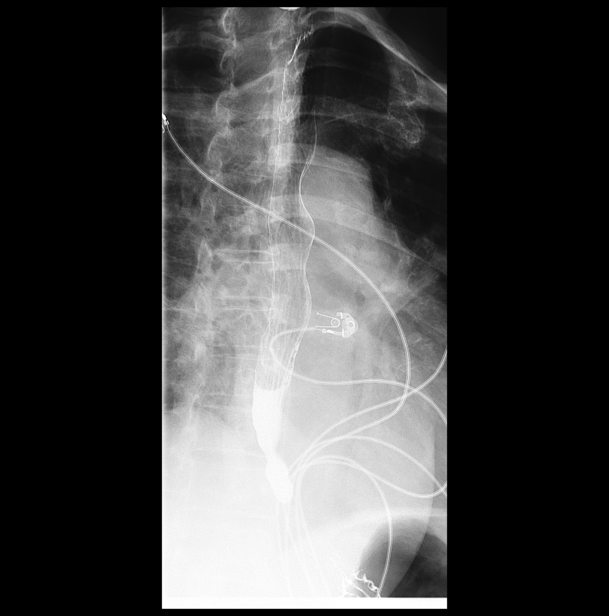

[Series 5: fluoro_barium 2fps_bw · 0.17mm/px · 1 of 1 slices shown (5 of 13)]
[im 1/1]
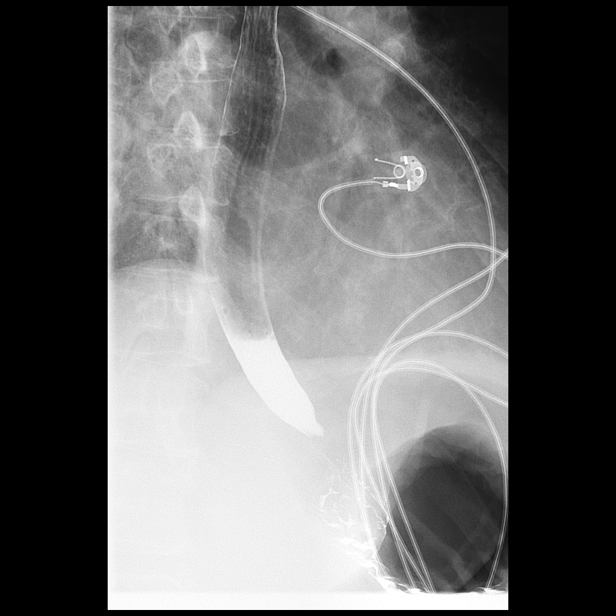

[Series 6: fluoro_barium 2fps_bw · 0.17mm/px · 1 of 1 slices shown (6 of 13)]
[im 1/1]
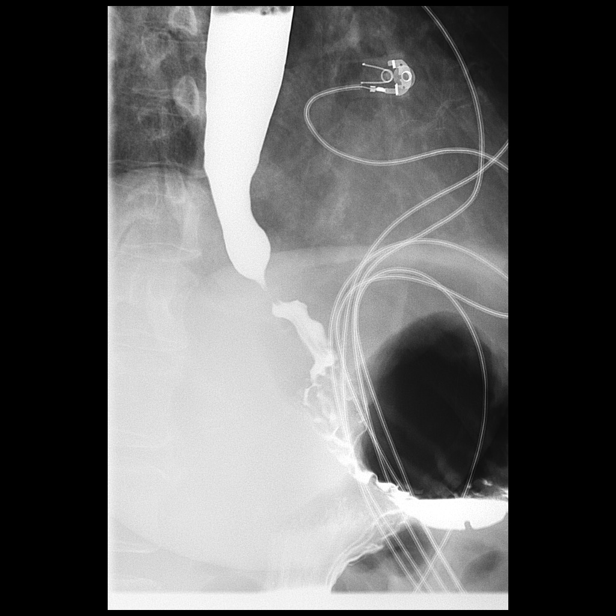

[Series 7: fluoro_barium 2fps_bw · 0.17mm/px · 1 of 1 slices shown (7 of 13)]
[im 1/1]
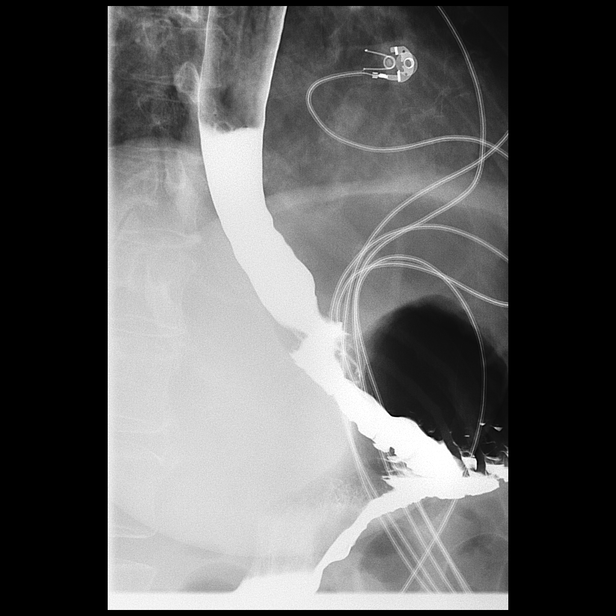

[Series 8: fluoro_barium 2fps_bw · 0.17mm/px · 1 of 1 slices shown (8 of 13)]
[im 1/1]
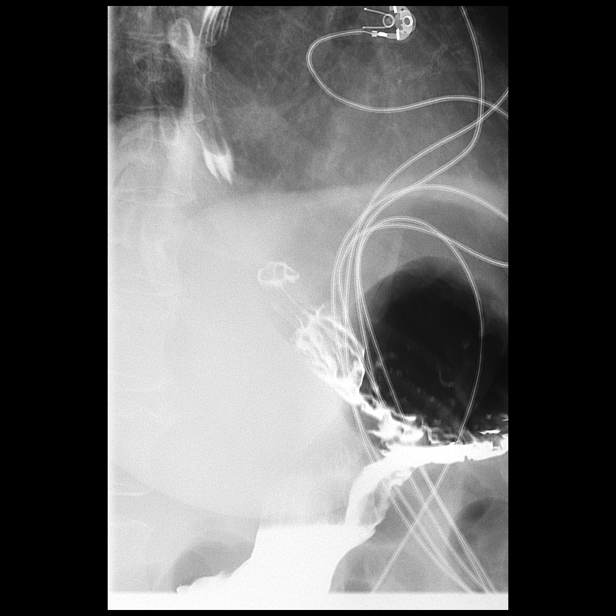

[Series 9: fluoro_barium 2fps_bw · 0.17mm/px · 1 of 1 slices shown (9 of 13)]
[im 1/1]
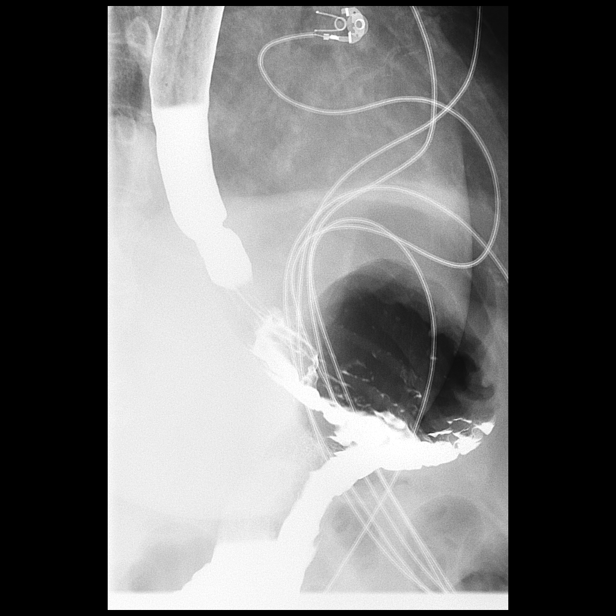

[Series 10: fluoro_barium 2fps_bw · 0.17mm/px · 1 of 1 slices shown (10 of 13)]
[im 1/1]
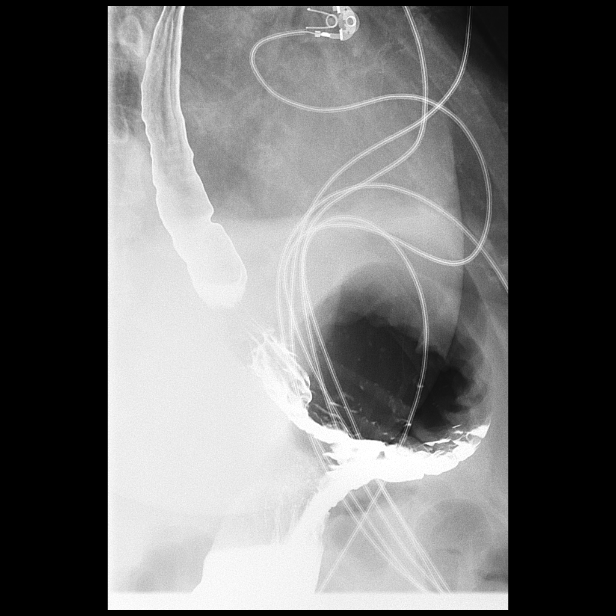

[Series 11: fluoro_barium 2fps_bw · 0.17mm/px · 1 of 1 slices shown (11 of 13)]
[im 1/1]
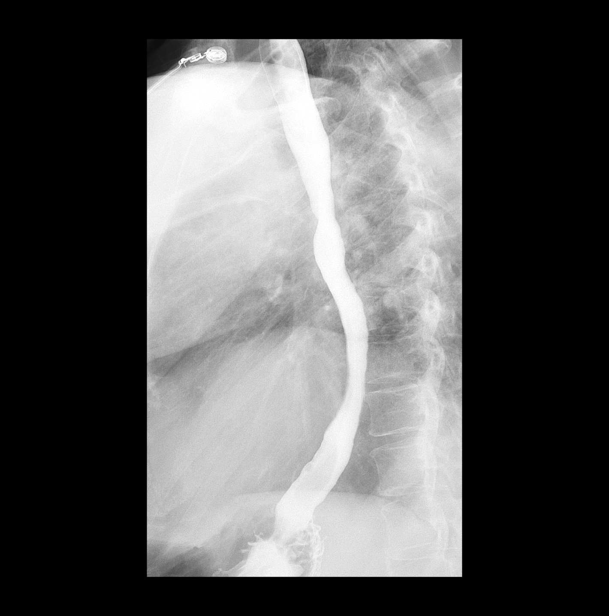

[Series 12: fluoro_barium 2fps_bw · 0.18mm/px · 1 of 1 slices shown (12 of 13)]
[im 1/1]
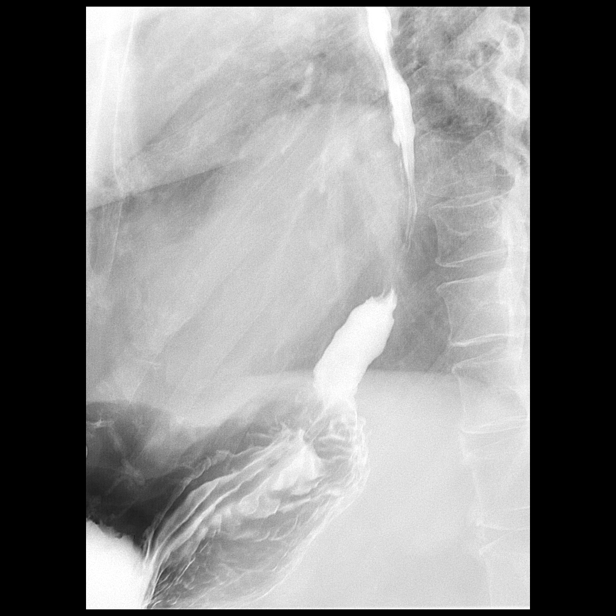

[Series 13: fluoro_barium 2fps_bw · 0.18mm/px · 1 of 1 slices shown (13 of 13)]
[im 1/1]
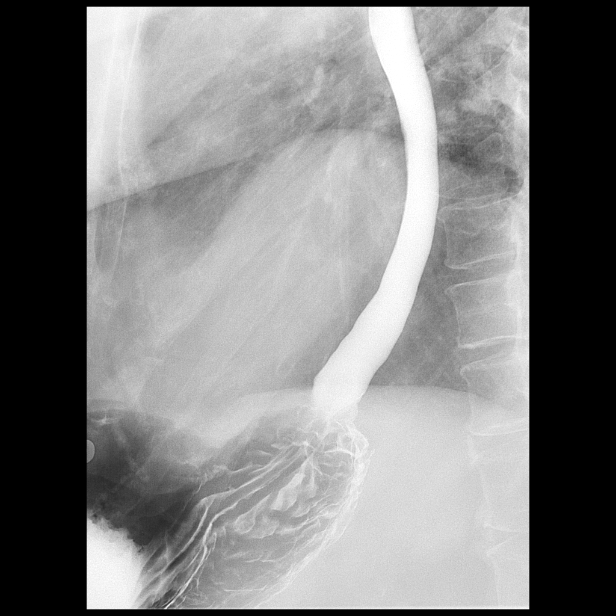

[14 of 14 positions shown; findings below may reference images not displayed]

FINDINGS: Pharyngeal swallowing function is within normal limits.

The esophagus is normal in course and in caliber. No mass or
stricture. Transient small hiatal hernia is noted. No reflux was
documented during the exam. No evidence of esophagitis.

There is esophageal dysmotility with mild distal tertiary
contractions and mild barium stasis.

13 mm barium tablet passes into the stomach without holdup.
IMPRESSION: 1. Esophageal dysmotility, mildly advanced for age. Small transient
hiatal hernia.
2. No other abnormality. No mass or stricture. No evidence of
esophagitis. No reflux documented.
